# Patient Record
Sex: Female | Born: 1935 | Race: Black or African American | Hispanic: No | Marital: Single | Smoking: Never smoker
Health system: Southern US, Community
[De-identification: ages and names within clinical notes are randomized; demographics above are authoritative.]

## PROBLEM LIST (undated history)

## (undated) DIAGNOSIS — M1990 Unspecified osteoarthritis, unspecified site: Secondary | ICD-10-CM

## (undated) DIAGNOSIS — M545 Low back pain, unspecified: Secondary | ICD-10-CM

## (undated) HISTORY — PX: OTHER SURGICAL HISTORY: SHX169

---

## 2004-04-01 ENCOUNTER — Emergency Department: Payer: Self-pay | Admitting: Unknown Physician Specialty

## 2004-04-01 ENCOUNTER — Other Ambulatory Visit: Payer: Self-pay

## 2004-12-19 ENCOUNTER — Ambulatory Visit: Payer: Self-pay | Admitting: Internal Medicine

## 2005-01-05 ENCOUNTER — Ambulatory Visit: Payer: Self-pay | Admitting: Internal Medicine

## 2005-11-21 ENCOUNTER — Ambulatory Visit: Payer: Self-pay | Admitting: Specialist

## 2005-12-10 ENCOUNTER — Emergency Department: Payer: Self-pay | Admitting: Emergency Medicine

## 2005-12-28 ENCOUNTER — Ambulatory Visit: Payer: Self-pay | Admitting: Internal Medicine

## 2007-01-01 ENCOUNTER — Ambulatory Visit: Payer: Self-pay | Admitting: Internal Medicine

## 2008-01-02 ENCOUNTER — Ambulatory Visit: Payer: Self-pay | Admitting: Internal Medicine

## 2008-04-04 IMAGING — RF DG BARIUM SWALLOW
1 series · 15 of 18 positions shown · non-contrast
Comparison: none

REASON FOR EXAM: dysphagia
COMMENTS:

[Series 1: run · 10 acquisitions, 15 frames shown]
[im 1/10]
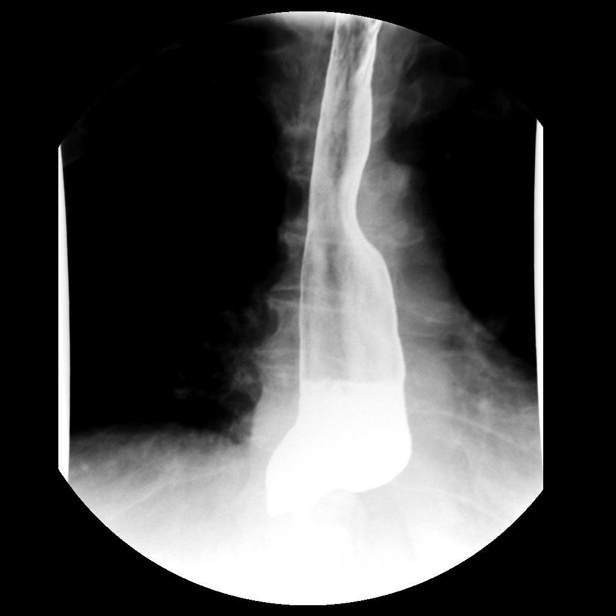
[im 1/10]
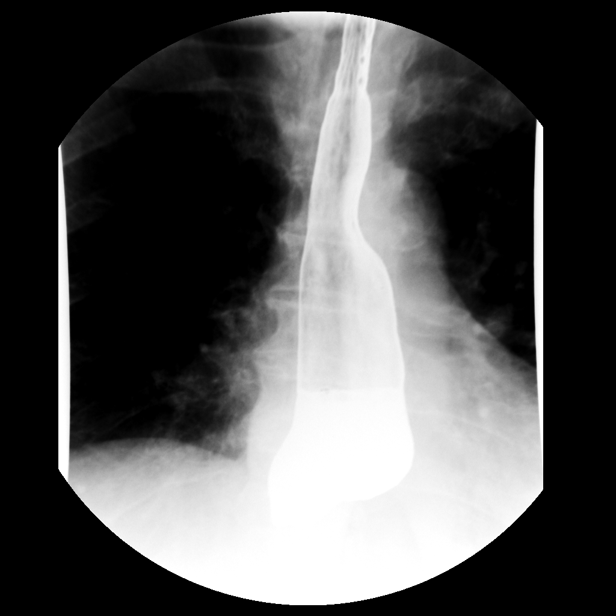
[im 2/10]
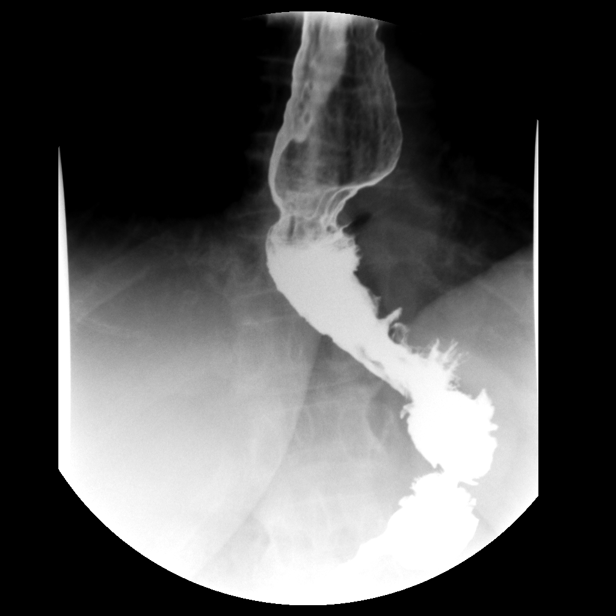
[im 3/10]
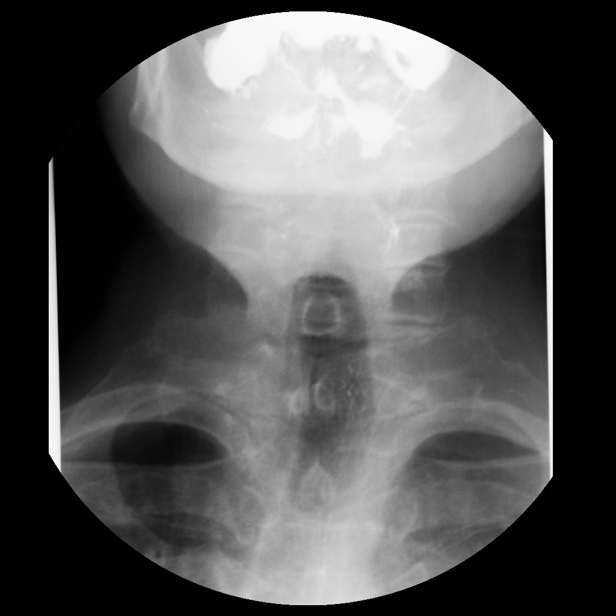
[im 3/10]
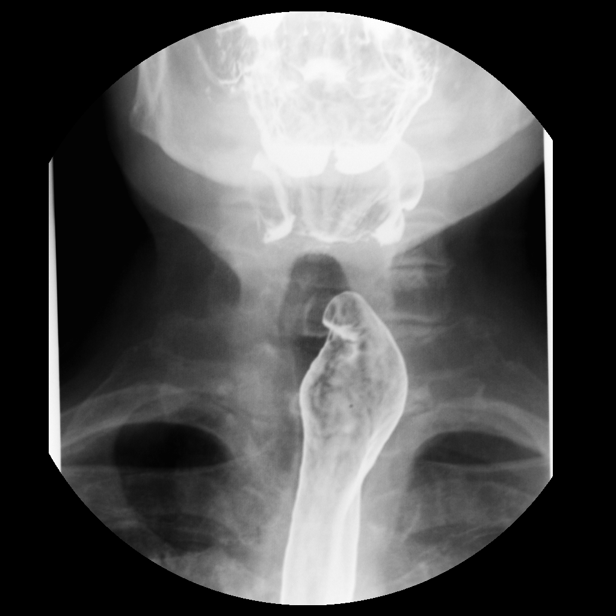
[im 3/10]
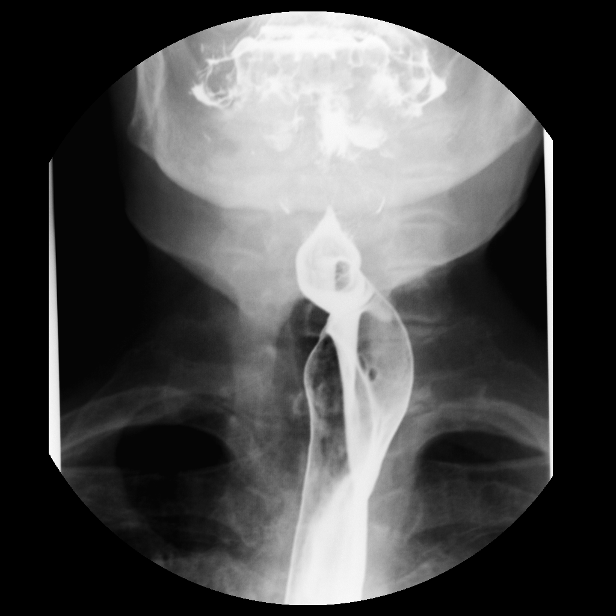
[im 3/10]
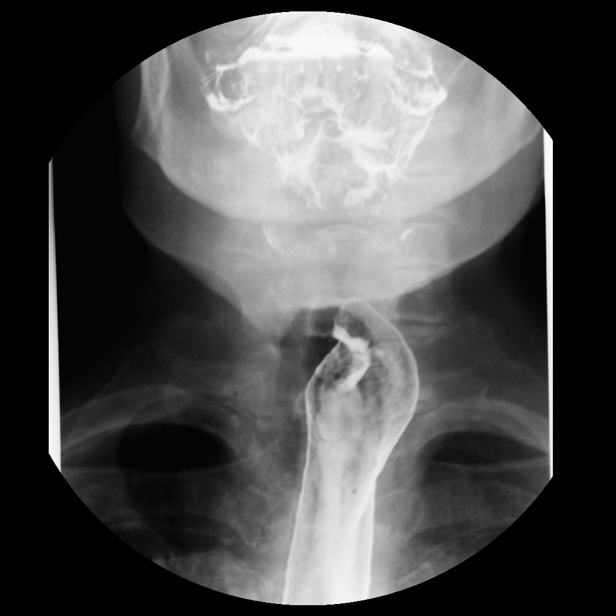
[im 4/10]
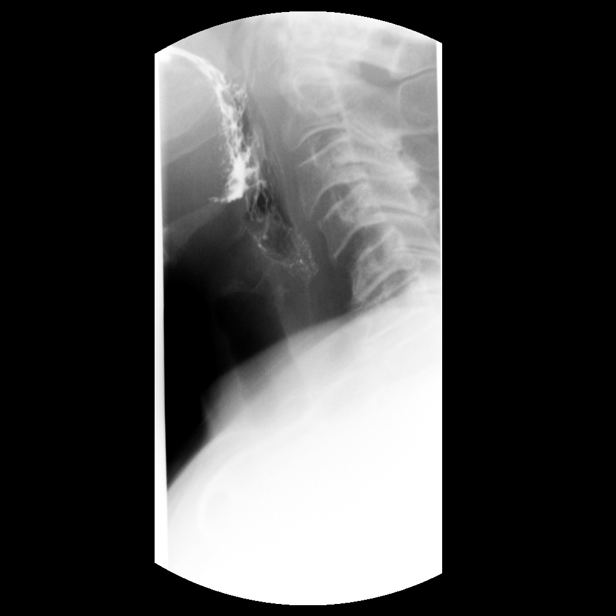
[im 4/10]
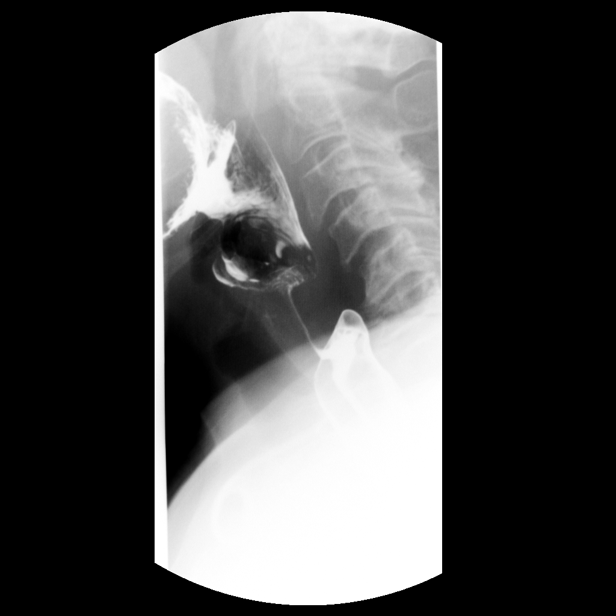
[im 4/10]
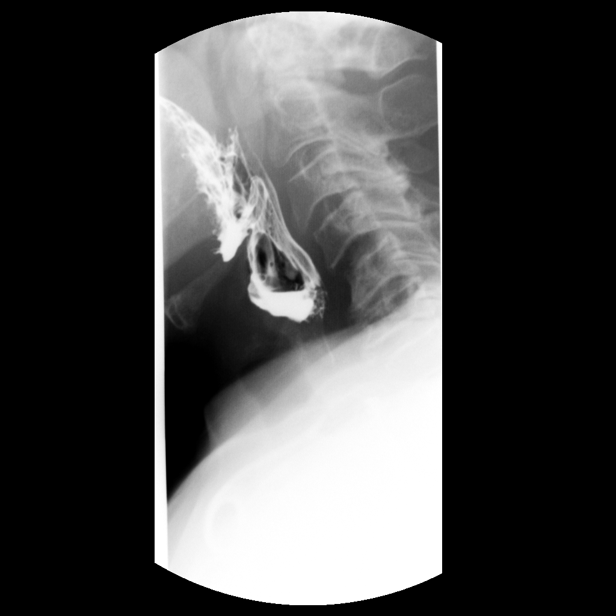
[im 5/10]
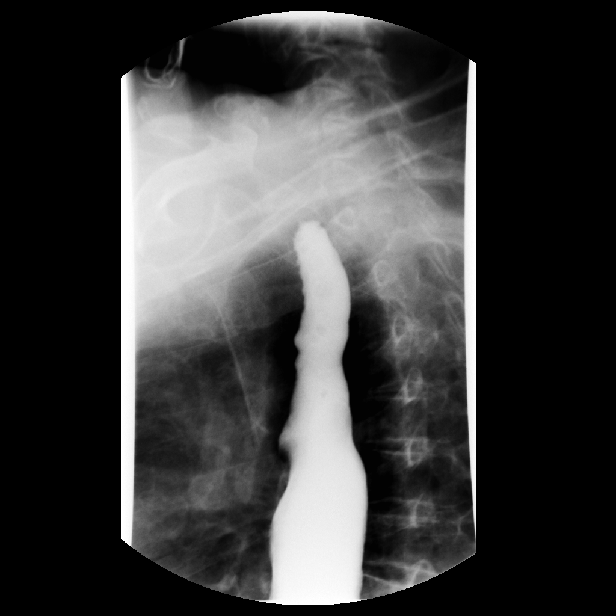
[im 6/10]
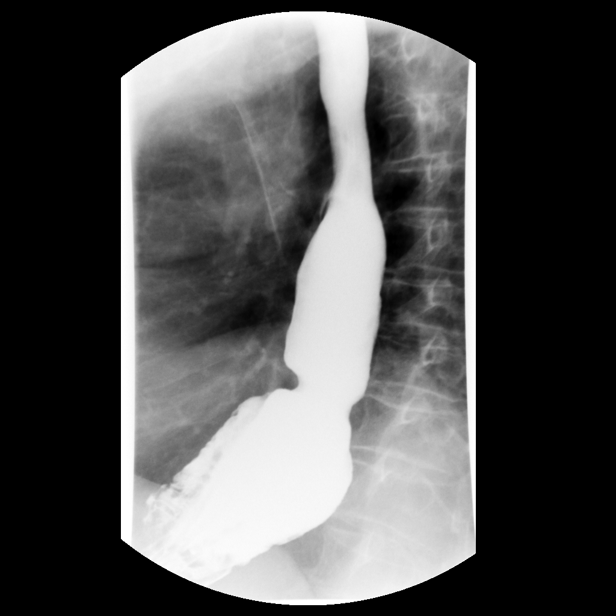
[im 8/10]
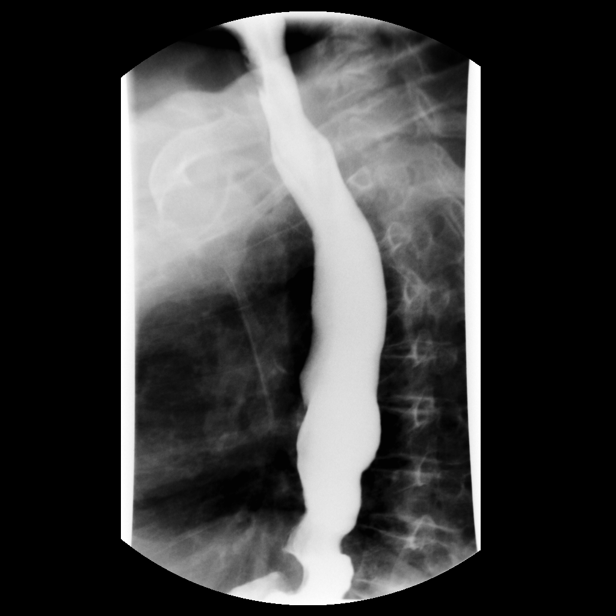
[im 9/10]
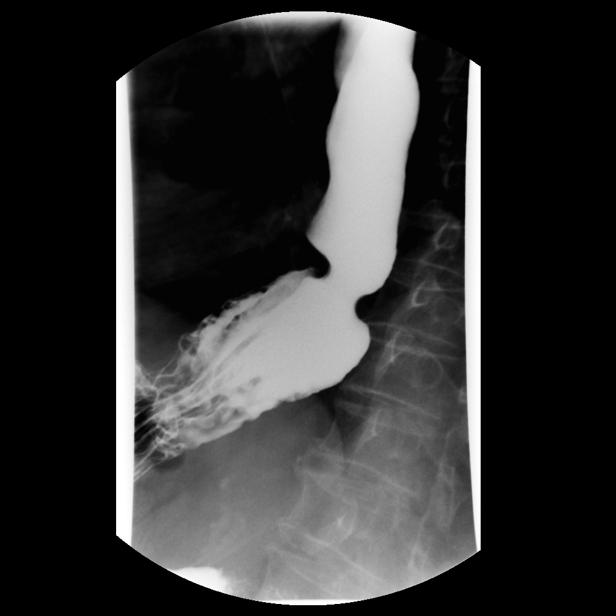
[im 10/10]
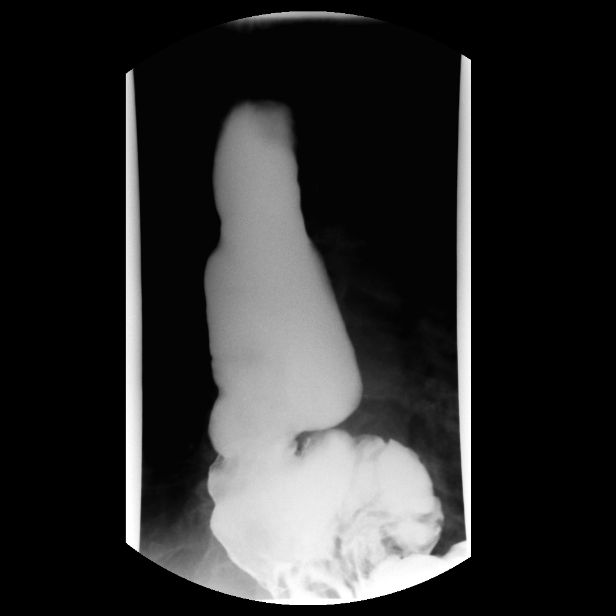

[15 of 18 positions shown; findings below may reference images not displayed]

PROCEDURE:     FL  - FL BARIUM SWALLOW  - November 21, 2005  [DATE]

RESULT:     The cervical esophagus was first examined.  Barium passed
through the cervical esophagus unobstructed. No areas of narrowing are noted.

The remainder of the esophagus reveals a hiatal hernia, direct sliding type,
with a large amount of gastroesophageal reflux noted.  There is some
distention of the lower esophagus.  The 12.5 mm barium impregnated tablet
traversed through the esophagus into the hiatal hernia without incident.
IMPRESSION: 1)Moderate sized direct sliding type hiatal hernia with a large amount of
gastroesophageal reflux noted.

## 2008-06-15 ENCOUNTER — Inpatient Hospital Stay: Payer: Self-pay | Admitting: Internal Medicine

## 2008-08-14 ENCOUNTER — Ambulatory Visit: Payer: Self-pay | Admitting: Internal Medicine

## 2009-03-16 ENCOUNTER — Ambulatory Visit: Payer: Self-pay | Admitting: Internal Medicine

## 2009-04-21 ENCOUNTER — Ambulatory Visit: Payer: Self-pay | Admitting: Internal Medicine

## 2009-05-03 ENCOUNTER — Ambulatory Visit: Payer: Self-pay | Admitting: Internal Medicine

## 2009-09-14 ENCOUNTER — Ambulatory Visit: Payer: Self-pay | Admitting: Internal Medicine

## 2010-03-25 ENCOUNTER — Ambulatory Visit: Payer: Self-pay | Admitting: Internal Medicine

## 2010-09-13 ENCOUNTER — Ambulatory Visit: Payer: Self-pay | Admitting: Otolaryngology

## 2010-09-27 ENCOUNTER — Encounter: Payer: Self-pay | Admitting: Otolaryngology

## 2010-10-04 ENCOUNTER — Encounter: Payer: Self-pay | Admitting: Otolaryngology

## 2011-05-03 ENCOUNTER — Ambulatory Visit: Payer: Self-pay | Admitting: Internal Medicine

## 2012-02-13 ENCOUNTER — Ambulatory Visit: Payer: Self-pay | Admitting: Internal Medicine

## 2012-03-26 ENCOUNTER — Ambulatory Visit: Payer: Self-pay | Admitting: Surgery

## 2012-03-27 ENCOUNTER — Ambulatory Visit: Payer: Self-pay | Admitting: Surgery

## 2012-04-01 ENCOUNTER — Inpatient Hospital Stay: Payer: Self-pay | Admitting: Surgery

## 2012-04-02 LAB — CBC WITH DIFFERENTIAL/PLATELET
Basophil %: 0.5 %
Eosinophil %: 2.4 %
HCT: 38.3 % (ref 35.0–47.0)
HGB: 12.4 g/dL (ref 12.0–16.0)
Lymphocyte %: 13.5 %
MCH: 26.2 pg (ref 26.0–34.0)
Monocyte #: 0.6 x10 3/mm (ref 0.2–0.9)
Monocyte %: 6.5 %
Neutrophil %: 77.1 %
RBC: 4.73 10*6/uL (ref 3.80–5.20)
RDW: 14.8 % — ABNORMAL HIGH (ref 11.5–14.5)
WBC: 9.9 10*3/uL (ref 3.6–11.0)

## 2012-04-02 LAB — BASIC METABOLIC PANEL
Calcium, Total: 8.5 mg/dL (ref 8.5–10.1)
Chloride: 108 mmol/L — ABNORMAL HIGH (ref 98–107)
Co2: 28 mmol/L (ref 21–32)
Creatinine: 1.07 mg/dL (ref 0.60–1.30)
Sodium: 142 mmol/L (ref 136–145)

## 2012-04-02 LAB — PATHOLOGY REPORT

## 2012-04-09 ENCOUNTER — Encounter: Payer: Self-pay | Admitting: Internal Medicine

## 2012-07-11 ENCOUNTER — Ambulatory Visit: Payer: Self-pay | Admitting: Internal Medicine

## 2013-08-12 ENCOUNTER — Ambulatory Visit: Payer: Self-pay | Admitting: Specialist

## 2013-09-08 ENCOUNTER — Ambulatory Visit: Payer: Self-pay | Admitting: Internal Medicine

## 2014-03-25 ENCOUNTER — Ambulatory Visit: Payer: Self-pay | Admitting: Unknown Physician Specialty

## 2014-07-16 ENCOUNTER — Ambulatory Visit: Payer: Self-pay | Admitting: Internal Medicine

## 2014-09-22 NOTE — Op Note (Signed)
PATIENT NAME:  ERNIE, SAGRERO MR#:  [id number] DATE OF BIRTH:  [birth year]  DATE OF PROCEDURE:  04/01/2012  PREOPERATIVE DIAGNOSES: Diverticulitis, colovesical fistula, urinary tract infection.   POSTOPERATIVE DIAGNOSES: Diverticulitis, colovesical fistula, urinary tract infection.   PROCEDURES: Low anterior resection of sigmoid colon.  SURGEON: Adella Hare, MD  ANESTHESIA: General.  INDICATIONS: This 81 year old female has recent symptoms of passing air in her urine, describes a noisy sound, also has recently had diffuse abdominal pain. Urine culture demonstrated bacteria. She reports nocturia x4.  She also reports extreme urgency to empty her bladder.   Recent CT scan demonstrated proximity of the sigmoid colon to the bladder with evidence of inflammation of the sigmoid colon. Barium enema demonstrated dye in the colon passed through a fistula into the bladder. Surgery is recommended for definitive treatment.   DESCRIPTION OF PROCEDURE: The patient was placed on the operating table, in the supine position, under general endotracheal anesthesia. The legs were elevated into the lithotomy position using bumblebee stirrups.  The circulating nurse inserted a Foley urinary catheter with Betadine preparation of the perineum draining a clear, yellow urine. Digital rectal examination evacuated a small amount of bilious fecal material which was cleaned away from the anal area.  The abdomen was prepared with Chlor Prep and perineum and anal area prepared with Betadine. The abdomen was draped out in a sterile manner.    A lower abdominal midline incision was made at the site of an old scar. The dissection was carried down through subcutaneous tissue. Several small bleeding points were cauterized.  The midline fascia was incised. The peritoneal cavity was opened.  Initial inspection revealed that there was adherence of the omentum to the lower abdomen in the region of the bladder and the anterior  abdominal wall. Multiple adhesions were taken down with blunt and sharp dissection freeing up the omentum from the bladder.  Manual examination demonstrated no palpable mass within the liver. There were multiple adhesions in the pelvis finding portions of the small intestine and these adhesions were taken down with blunt and sharp dissection.  Visible small intestines otherwise appeared normal. The patient was placed in Trendelenburg position and small bowel was brought up out of the pelvis and held with three moist lap packs.  Next, the sigmoid colon was mobilized with dissection of multiple adhesions separating it from the left pelvic sidewall.  Sigmoid colon was also mobilized with excision of the retroperitoneum.  Further dissection was carried out where the sigmoid colon was found to be densely adherent to the bladder.  There was evidence of marked inflammation. I did not actually see any pus. Dissection was carried out to separate the colon from the bladder using blunt and sharp dissection and use of Harmonic scalpel and electrocautery. With time the sigmoid colon was freed up from the bladder. There was an inflammatory mass within this portion of sigmoid colon and after separating it the bladder was examined. There was no actual urine seen leaking out as this was observed.  Hemostasis adjacent to the bladder was intact.  Next, the distal sigmoid colon was further mobilized with dissection of multiple adhesions and elected to remove a short segment of colon which had been adherent to the bladder. This segment was approximately 5 inches in length and the proximal and distal margins were selected.  Proximal margin was dissected creating a window in the mesentery and mesenteric dissection was begun with a Harmonic scalpel. Also, the distal portion deep within the pelvis was  dissected so that a window was created around the distal portion of sigmoid colon and Harmonic scalpel was used to begin mesenteric  dissection at that point. It was noted that during the course of mesenteric dissection there was one palpable artery which was suture ligated with 0 chromic and then divided with the Harmonic scalpel.  To further facilitate mesenteric dissection, the proximal portion of bowel was divided with a GIA stapler and then the mesenteric dissection was further completed. The TIA stapler was placed across the distal portion of bowel. The proximal end was tagged with a stitch and the bowel was passed off to a side table. Next, I could see that hemostasis was intact.  A medium sizer was inserted through a small opening in the drape into the rectum, however, I found, with bimanual palpation, that the sizer would not pass from the rectum up to the staple line and elected to do the anastomosis with triangulation technique.  That one glove was changed and subsequently excised each of the staple lines with electrocautery. The two segments of bowel were brought adjacent to one another. The anastomosis was begun with the posterior wall using the T-30 stapler.  A portion of the anterior wall was incised and lengthened the staple line and the anastomosis was completed with two additional applications of the TIA-30 stapler. The anastomosis was inspected and appeared to be widely patent. The anterior portion of anastomosis which would be closest to the bladder was imbricated with 5-0 Vicryl. The mesenteric defect was minimal. The pelvis was irrigated with warm saline solution and inspected and hemostasis was intact.  Next, the lap packs were counted as they were removed and count was correct. I further examined the omentum and appeared that although it had been peeled away from the bladder with perineal elasticity would now easily reach the bladder so it was not sutured to separate the colon from the bladder as there was inadequate omentum. Next, the omentum was brought beneath the wound. The peritoneum was closed with a running 0  chromic suture. The fascia was closed with interrupted 0 Maxon figure-of-eight sutures, skin was closed with clips, and dressings were applied with paper tape.  It was noted that there was slight blood tinge in the urine and it is noted that the bladder was again inspected prior to closure and there was no urine seen leaking.   The patient appeared to tolerate the procedure satisfactorily and was then prepared for transfer to the recovery room. ____________________________ Shela CommonsJ. Renda RollsWilton Smith, MD jws:slb D: 04/01/2012 12:31:53 ET T: 04/01/2012 12:37:24 ET JOB#: [id number]  cc: Adella HareJ. Wilton Smith, MD, <Dictator> Adella HareWILTON J SMITH MD ELECTRONICALLY SIGNED 04/02/2012 10:52

## 2014-09-22 NOTE — Discharge Summary (Signed)
PATIENT NAME:  Gloria HutchingTERSON, Genia M MR#:  [id number] DATE OF BIRTH:  05-Oct-1915  DATE OF ADMISSION:  04/01/2012 DATE OF DISCHARGE:  04/09/2012   HISTORY OF PRESENT ILLNESS: This 81 year old female was admitted for elective surgery. She has a history of pneumaturia, dysuria, and urinary tract infection. She had CT findings of inflammatory mass involving the sigmoid colon adjacent to the bladder with air seen in the bladder. She also has had barium enema which demonstrated colovesical fistula.   PAST MEDICAL HISTORY:  1. Chronic obstructive pulmonary disease.  2. Sleep apnea. Has a CPAP machine but does not always use it.   Other details of past medical history, medicines, and physical findings are recorded on the typed history and physical.   It is noted that recent lab work included a hemoglobin of 13.2 and a urine culture the week before admission demonstrated Escherichia coli 80,000 colonies/mL which was a catheterized specimen.  HOSPITAL COURSE: The patient was brought into the outpatient surgery department after a bowel prep at home, carried to the operating room where she had a low anterior resection of the sigmoid colon and within that port colon was dissected free from the bladder and resected with primary anastomosis. She did receive a preop prophylactic antibiotic and then the next day began her on Bactrim as treatment for her preop condition of urinary infection. She was also treated with subcutaneous heparin prophylactic. She was given IV fluids and with time began a liquid diet and gradually advanced. She did have two small bowel movements not long after surgery. She has laid in the hospital stay, gone a number of days without a bowel movement and has been treated on November 4th with Milk of Magnesia and MiraLAX. She reports resolution of pneumaturia and has been voiding satisfactorily, having no dysuria. She has been walking in the hall, taking solid food gradually and recovering. Her wound  is healing satisfactorily. Abdomen is with minimal tenderness.   Pathology demonstrated diverticulitis with fistulous tract.    FINAL DIAGNOSES:  1. Colovesical fistula. 2. Diverticulitis.  3. Urinary tract infection.   OPERATION: Low anterior resection of the sigmoid colon.   Anticipating discharge once bowel function demonstrated. [facility] one or two weeks of rehabilitation and then return home and will make plans for follow-up in the office.   ____________________________ J. Renda RollsWilton Smith, MD jws:drc D: 04/08/2012 18:07:17 ET T: 04/09/2012 10:19:46 ET JOB#: [id number]  cc: Adella HareJ. Wilton Smith, MD, <Dictator> Adella HareWILTON J SMITH MD ELECTRONICALLY SIGNED 04/09/2012 11:34

## 2014-10-08 ENCOUNTER — Other Ambulatory Visit: Payer: Self-pay | Admitting: Internal Medicine

## 2014-10-08 DIAGNOSIS — Z1231 Encounter for screening mammogram for malignant neoplasm of breast: Secondary | ICD-10-CM

## 2014-10-28 ENCOUNTER — Ambulatory Visit: Payer: Self-pay

## 2014-10-30 ENCOUNTER — Ambulatory Visit
Admission: RE | Admit: 2014-10-30 | Discharge: 2014-10-30 | Disposition: A | Discharge status: Home / Self Care | Payer: Medicare Other | Source: Ambulatory Visit | Attending: Internal Medicine | Admitting: Internal Medicine

## 2014-10-30 ENCOUNTER — Other Ambulatory Visit: Payer: Self-pay | Admitting: Internal Medicine

## 2014-10-30 DIAGNOSIS — Z1231 Encounter for screening mammogram for malignant neoplasm of breast: Secondary | ICD-10-CM | POA: Diagnosis not present

## 2015-08-30 ENCOUNTER — Other Ambulatory Visit: Payer: Self-pay | Admitting: Unknown Physician Specialty

## 2015-08-30 DIAGNOSIS — M1712 Unilateral primary osteoarthritis, left knee: Secondary | ICD-10-CM

## 2015-09-03 ENCOUNTER — Ambulatory Visit
Admission: RE | Admit: 2015-09-03 | Discharge: 2015-09-03 | Disposition: A | Discharge status: Home / Self Care | Payer: Medicare Other | Source: Ambulatory Visit | Attending: Unknown Physician Specialty | Admitting: Unknown Physician Specialty

## 2015-09-03 DIAGNOSIS — M1712 Unilateral primary osteoarthritis, left knee: Secondary | ICD-10-CM | POA: Diagnosis present

## 2015-09-27 ENCOUNTER — Encounter
Admission: RE | Admit: 2015-09-27 | Discharge: 2015-09-27 | Disposition: A | Discharge status: Home / Self Care | Payer: Medicare Other | Source: Ambulatory Visit | Attending: Internal Medicine | Admitting: Internal Medicine

## 2015-10-04 ENCOUNTER — Encounter
Admission: RE | Admit: 2015-10-04 | Discharge: 2015-10-04 | Disposition: A | Discharge status: Home / Self Care | Payer: Medicare Other | Source: Ambulatory Visit | Attending: Internal Medicine | Admitting: Internal Medicine

## 2015-11-18 ENCOUNTER — Other Ambulatory Visit: Payer: Self-pay | Admitting: Internal Medicine

## 2015-11-18 DIAGNOSIS — Z1231 Encounter for screening mammogram for malignant neoplasm of breast: Secondary | ICD-10-CM

## 2015-12-03 ENCOUNTER — Ambulatory Visit
Admission: RE | Admit: 2015-12-03 | Discharge: 2015-12-03 | Disposition: A | Discharge status: Home / Self Care | Payer: Medicare Other | Source: Ambulatory Visit | Attending: Internal Medicine | Admitting: Internal Medicine

## 2015-12-03 ENCOUNTER — Other Ambulatory Visit: Payer: Self-pay | Admitting: Internal Medicine

## 2015-12-03 DIAGNOSIS — Z1231 Encounter for screening mammogram for malignant neoplasm of breast: Secondary | ICD-10-CM

## 2018-05-21 ENCOUNTER — Other Ambulatory Visit: Payer: Self-pay

## 2018-05-21 ENCOUNTER — Emergency Department
Admission: EM | Admit: 2018-05-21 | Discharge: 2018-05-21 | Disposition: A | Discharge status: Home / Self Care | Payer: Medicare Other | Attending: Emergency Medicine | Admitting: Emergency Medicine

## 2018-05-21 ENCOUNTER — Encounter: Payer: Self-pay | Admitting: Emergency Medicine

## 2018-05-21 ENCOUNTER — Emergency Department: Payer: Medicare Other

## 2018-05-21 DIAGNOSIS — Z79899 Other long term (current) drug therapy: Secondary | ICD-10-CM | POA: Diagnosis not present

## 2018-05-21 DIAGNOSIS — M159 Polyosteoarthritis, unspecified: Secondary | ICD-10-CM | POA: Diagnosis not present

## 2018-05-21 DIAGNOSIS — R531 Weakness: Secondary | ICD-10-CM | POA: Diagnosis present

## 2018-05-21 DIAGNOSIS — R102 Pelvic and perineal pain: Secondary | ICD-10-CM | POA: Insufficient documentation

## 2018-05-21 LAB — URINALYSIS, COMPLETE (UACMP) WITH MICROSCOPIC
BACTERIA UA: NONE SEEN
Bilirubin Urine: NEGATIVE
Glucose, UA: NEGATIVE mg/dL
Ketones, ur: NEGATIVE mg/dL
Leukocytes, UA: NEGATIVE
Nitrite: NEGATIVE
Protein, ur: NEGATIVE mg/dL
Specific Gravity, Urine: 1.012 (ref 1.005–1.030)
pH: 6 (ref 5.0–8.0)

## 2018-05-21 LAB — BASIC METABOLIC PANEL
Anion gap: 7 (ref 5–15)
BUN: 21 mg/dL (ref 8–23)
CO2: 26 mmol/L (ref 22–32)
CREATININE: 1.04 mg/dL — AB (ref 0.44–1.00)
Calcium: 9.5 mg/dL (ref 8.9–10.3)
Chloride: 106 mmol/L (ref 98–111)
GFR calc Af Amer: 51 mL/min — ABNORMAL LOW (ref >60)
GFR calc non Af Amer: 44 mL/min — ABNORMAL LOW (ref >60)
Glucose, Bld: 104 mg/dL — ABNORMAL HIGH (ref 70–99)
Potassium: 4.2 mmol/L (ref 3.5–5.1)
Sodium: 139 mmol/L (ref 135–145)

## 2018-05-21 LAB — CBC
HCT: 41 % (ref 36.0–46.0)
Hemoglobin: 13 g/dL (ref 12.0–15.0)
MCH: 27.1 pg (ref 26.0–34.0)
MCHC: 31.7 g/dL (ref 30.0–36.0)
MCV: 85.4 fL (ref 80.0–100.0)
Platelets: 211 10*3/uL (ref 150–400)
RBC: 4.8 MIL/uL (ref 3.87–5.11)
RDW: 13.3 % (ref 11.5–15.5)
WBC: 4.7 10*3/uL (ref 4.0–10.5)
nRBC: 0 % (ref 0.0–0.2)

## 2018-05-21 NOTE — ED Triage Notes (Signed)
Pt reports that for over a week she has gotten more weak and having a more unsteady gait. States that her back and hips are hurting her. She reports that she feels that she is always tired and dizzy at times.

## 2018-05-21 NOTE — ED Provider Notes (Signed)
[facility] Emergency Department Provider Note  ____________________________________________  Time seen: Approximately 8:21 PM  I have reviewed the triage vital signs and the nursing notes.   HISTORY  Chief Complaint Weakness    HPI Gloria Barnes is a 84 y.o. female with past medical history of osteoarthritis who complains of increased difficulty walking over the past several weeks.  This is in the setting of increase in her chronic osteoarthritis pain of bilateral hips and lower back.  She saw a rheumatology several weeks ago who diagnosed her with vitamin D deficiency and started her on a 12-week regimen of weekly vitamin D injections.  After 3 weeks of this, she states that she is starting to feel little bit better.  She denies headache vision changes paresthesias or motor weakness.  Denies being off balance or falls or head injury.  She reports her last fall was about 4 years ago.        History reviewed. No pertinent past medical history.   There are no active problems to display for this patient.    History reviewed. No pertinent surgical history.   Prior to Admission medications   Medication Sig Start Date End Date Taking? Authorizing Provider  acetaminophen (TYLENOL) 650 MG CR tablet Take 650 mg by mouth every 8 (eight) hours as needed.   Yes [provider]  diclofenac sodium (VOLTAREN) 1 % GEL APPLY AS NEEDED ON THE PAINFUL AREA 05/01/18  Yes [provider]  Multiple Vitamin (MULTI-VITAMINS) TABS Take 1 tablet by mouth daily.   Yes [provider]  Omega-3 Fatty Acids (FISH OIL) 1000 MG CAPS Take 1,000 mg by mouth daily.   Yes [provider]  omeprazole (PRILOSEC) 20 MG capsule Take 20 mg by mouth daily. 05/30/17  Yes [provider]  Vitamin D, Ergocalciferol, (DRISDOL) 1.25 MG (50000 UT) CAPS capsule Take 50,000 Units by mouth once a week. 05/06/18  Yes [provider]      Allergies Patient has no known allergies.   Family History  Problem Relation Age of Onset  . Breast cancer Neg Hx     Social History Social History   Tobacco Use  . Smoking status: Never Smoker  Substance Use Topics  . Alcohol use: Not on file  . Drug use: Not on file    Review of Systems  Constitutional:   No fever or chills.  ENT:   No sore throat. No rhinorrhea. Cardiovascular:   No chest pain or syncope. Respiratory:   No dyspnea or cough. Gastrointestinal:   Negative for abdominal pain, vomiting and diarrhea.  Musculoskeletal:   Acute on chronic low back pain and right hip pain. All other systems reviewed and are negative except as documented above in ROS and HPI.  ____________________________________________   PHYSICAL EXAM:  VITAL SIGNS: ED Triage Vitals  Enc Vitals Group     BP 05/21/18 1417 (!) 146/89     Pulse Rate 05/21/18 1417 75     Resp 05/21/18 1530 19     Temp 05/21/18 1417 97.8 F (36.6 C)     Temp Source 05/21/18 1417 Oral     SpO2 05/21/18 1417 100 %     Weight 05/21/18 1417 165 lb (74.8 kg)     Height 05/21/18 1417 5\' 2"  (1.575 m)     Head Circumference --      Peak Flow --      Pain Score 05/21/18 1429 10     Pain Loc --  Pain Edu? --      Excl. in GC? --     Vital signs reviewed, nursing assessments reviewed.   Constitutional:   Alert and oriented. Non-toxic appearance. Eyes:   Conjunctivae are normal. EOMI. PERRL. ENT      Head:   Normocephalic and atraumatic.      Nose:   No congestion/rhinnorhea.       Mouth/Throat:   MMM, no pharyngeal erythema. No peritonsillar mass.       Neck:   No meningismus. Full ROM. Hematological/Lymphatic/Immunilogical:   No cervical lymphadenopathy. Cardiovascular:   RRR. Symmetric bilateral radial and DP pulses.  No murmurs. Cap refill less than 2 seconds. Respiratory:   Normal respiratory effort without tachypnea/retractions. Breath sounds are clear and equal bilaterally. No  wheezes/rales/rhonchi. Gastrointestinal:   Soft and nontender. Non distended. There is no CVA tenderness.  No rebound, rigidity, or guarding.  Musculoskeletal:   Normal range of motion in all extremities. No joint effusions.  No lower extremity tenderness.  No edema.  No midline spinal tenderness.  No focal bony tenderness.  Does have some tenderness centered around the right SI joint. Neurologic:   Normal speech and language.  Motor grossly intact. No acute focal neurologic deficits are appreciated.  Skin:    Skin is warm, dry and intact. No rash noted.  No petechiae, purpura, or bullae.  ____________________________________________    LABS (pertinent positives/negatives) (all labs ordered are listed, but only abnormal results are displayed) Labs Reviewed  BASIC METABOLIC PANEL - Abnormal; Notable for the following components:      Result Value   Glucose, Bld 104 (*)    Creatinine, Ser 1.04 (*)    GFR calc non Af Amer 44 (*)    GFR calc Af Amer 51 (*)    All other components within normal limits  URINALYSIS, COMPLETE (UACMP) WITH MICROSCOPIC - Abnormal; Notable for the following components:   Color, Urine YELLOW (*)    APPearance CLEAR (*)    Hgb urine dipstick SMALL (*)    All other components within normal limits  CBC  CBG MONITORING, ED   ____________________________________________   EKG  Interpreted by me Sinus rhythm rate of 76, left axis, first-degree AV block.  Normal QRS ST segments and T waves.  ____________________________________________    RADIOLOGY  Dg Lumbar Spine 2-3 Views  Result Date: 05/21/2018 CLINICAL DATA:  Right-sided low back pain extending down right hip without known injury. EXAM: LUMBAR SPINE - 2-3 VIEW COMPARISON:  Lumbar spine MRI 07/16/2014 FINDINGS: There is thoracolumbar spondylosis with levoscoliosis. There are 5 non ribbed lumbar type vertebrae. Spinal decompression defect noted at L3. T12 through L4 marked degenerative disc disease  with vacuum disc phenomena. No acute fracture. Moderate aortoiliac atherosclerosis without aneurysm. IMPRESSION: 1. Thoracolumbar spondylosis with levoscoliosis. 2. Spinal decompression defect at L3. 3. No acute osseous abnormality. 4. T12 through L4 marked degenerative disc disease. Electronically Signed   By: Tollie Eth M.D.   On: 05/21/2018 18:52   Dg Pelvis 1-2 Views  Result Date: 05/21/2018 CLINICAL DATA:  Pelvic pain. EXAM: PELVIS - 1-2 VIEW COMPARISON:  None. FINDINGS: Both hips are normally located. Mild/moderate degenerative changes but no fracture or AVN. The pubic symphysis and SI joints are intact. Mild degenerative changes. No pelvic fractures. IMPRESSION: No acute bony findings. Electronically Signed   By: Rudie Meyer M.D.   On: 05/21/2018 18:49    ____________________________________________   PROCEDURES Procedures  ____________________________________________    CLINICAL IMPRESSION / ASSESSMENT AND  PLAN / ED COURSE  Pertinent labs & imaging results that were available during my care of the patient were reviewed by me and considered in my medical decision making (see chart for details).    Patient presents with worsening of her musculoskeletal pain which is a chronic issue for her.  No new falls or trauma.  No neurologic symptoms.  Doubt ischemic or hemorrhagic stroke, increased ICP, infectious process or dehydration.  Vital signs are unremarkable.  Labs are unremarkable.  X-rays of lumbar spine and pelvis are unremarkable.  Patient is ambulatory with a steady gait.  Recommended she increase her walking at home to build strength.  Reminded them that they have a Voltaren prescription from their primary care doctor which they can pick up and apply locally to the painful area which can help with symptoms 2.  Counseled to return to PCP for consideration of physical therapy if symptoms are not improving.      ____________________________________________   FINAL CLINICAL  IMPRESSION(S) / ED DIAGNOSES    Final diagnoses:  Osteoarthritis of multiple joints, unspecified osteoarthritis type     ED Discharge Orders    None      Portions of this note were generated with dragon dictation software. Dictation errors may occur despite best attempts at proofreading.   Sharman CheekStafford, Zeenat Jeanbaptiste, MD 05/21/18 2035

## 2018-05-21 NOTE — ED Notes (Signed)
ED Provider at bedside. 

## 2018-06-17 ENCOUNTER — Emergency Department: Payer: Medicare Other

## 2018-06-17 ENCOUNTER — Observation Stay
Admission: EM | Admit: 2018-06-17 | Discharge: 2018-06-18 | Disposition: A | Discharge status: Home / Self Care | Payer: Medicare Other | Attending: Internal Medicine | Admitting: Internal Medicine

## 2018-06-17 ENCOUNTER — Encounter: Payer: Self-pay | Admitting: Emergency Medicine

## 2018-06-17 ENCOUNTER — Other Ambulatory Visit: Payer: Self-pay

## 2018-06-17 DIAGNOSIS — Z96651 Presence of right artificial knee joint: Secondary | ICD-10-CM | POA: Diagnosis not present

## 2018-06-17 DIAGNOSIS — R9431 Abnormal electrocardiogram [ECG] [EKG]: Secondary | ICD-10-CM | POA: Insufficient documentation

## 2018-06-17 DIAGNOSIS — I248 Other forms of acute ischemic heart disease: Secondary | ICD-10-CM | POA: Insufficient documentation

## 2018-06-17 DIAGNOSIS — Z8249 Family history of ischemic heart disease and other diseases of the circulatory system: Secondary | ICD-10-CM | POA: Diagnosis not present

## 2018-06-17 DIAGNOSIS — R7989 Other specified abnormal findings of blood chemistry: Secondary | ICD-10-CM | POA: Insufficient documentation

## 2018-06-17 DIAGNOSIS — R2689 Other abnormalities of gait and mobility: Secondary | ICD-10-CM | POA: Insufficient documentation

## 2018-06-17 DIAGNOSIS — R42 Dizziness and giddiness: Secondary | ICD-10-CM | POA: Insufficient documentation

## 2018-06-17 DIAGNOSIS — R778 Other specified abnormalities of plasma proteins: Secondary | ICD-10-CM

## 2018-06-17 DIAGNOSIS — G8929 Other chronic pain: Secondary | ICD-10-CM | POA: Diagnosis not present

## 2018-06-17 DIAGNOSIS — Z79899 Other long term (current) drug therapy: Secondary | ICD-10-CM | POA: Insufficient documentation

## 2018-06-17 DIAGNOSIS — M47899 Other spondylosis, site unspecified: Secondary | ICD-10-CM | POA: Insufficient documentation

## 2018-06-17 DIAGNOSIS — Z791 Long term (current) use of non-steroidal anti-inflammatories (NSAID): Secondary | ICD-10-CM | POA: Diagnosis not present

## 2018-06-17 DIAGNOSIS — R2681 Unsteadiness on feet: Secondary | ICD-10-CM | POA: Diagnosis not present

## 2018-06-17 DIAGNOSIS — M4802 Spinal stenosis, cervical region: Secondary | ICD-10-CM | POA: Diagnosis not present

## 2018-06-17 DIAGNOSIS — W19XXXA Unspecified fall, initial encounter: Secondary | ICD-10-CM | POA: Insufficient documentation

## 2018-06-17 DIAGNOSIS — M6281 Muscle weakness (generalized): Secondary | ICD-10-CM | POA: Insufficient documentation

## 2018-06-17 DIAGNOSIS — S39012A Strain of muscle, fascia and tendon of lower back, initial encounter: Principal | ICD-10-CM | POA: Insufficient documentation

## 2018-06-17 DIAGNOSIS — T796XXA Traumatic ischemia of muscle, initial encounter: Secondary | ICD-10-CM | POA: Diagnosis not present

## 2018-06-17 DIAGNOSIS — M549 Dorsalgia, unspecified: Secondary | ICD-10-CM | POA: Diagnosis present

## 2018-06-17 DIAGNOSIS — M5136 Other intervertebral disc degeneration, lumbar region: Secondary | ICD-10-CM | POA: Diagnosis not present

## 2018-06-17 DIAGNOSIS — Z23 Encounter for immunization: Secondary | ICD-10-CM | POA: Diagnosis not present

## 2018-06-17 HISTORY — DX: Low back pain, unspecified: M54.50

## 2018-06-17 HISTORY — DX: Unspecified osteoarthritis, unspecified site: M19.90

## 2018-06-17 HISTORY — DX: Low back pain: M54.5

## 2018-06-17 LAB — TROPONIN I
Troponin I: 0.05 ng/mL (ref <0.03)
Troponin I: 0.05 ng/mL (ref <0.03)

## 2018-06-17 LAB — CK
Total CK: 446 U/L — ABNORMAL HIGH (ref 38–234)
Total CK: 531 U/L — ABNORMAL HIGH (ref 38–234)

## 2018-06-17 LAB — URINALYSIS, COMPLETE (UACMP) WITH MICROSCOPIC
Bacteria, UA: NONE SEEN
Bilirubin Urine: NEGATIVE
GLUCOSE, UA: NEGATIVE mg/dL
Hgb urine dipstick: NEGATIVE
Ketones, ur: NEGATIVE mg/dL
Nitrite: NEGATIVE
Protein, ur: NEGATIVE mg/dL
Specific Gravity, Urine: 1.015 (ref 1.005–1.030)
pH: 6 (ref 5.0–8.0)

## 2018-06-17 LAB — BASIC METABOLIC PANEL
ANION GAP: 6 (ref 5–15)
BUN: 20 mg/dL (ref 8–23)
CO2: 26 mmol/L (ref 22–32)
Calcium: 9.5 mg/dL (ref 8.9–10.3)
Chloride: 108 mmol/L (ref 98–111)
Creatinine, Ser: 0.99 mg/dL (ref 0.44–1.00)
GFR calc Af Amer: 54 mL/min — ABNORMAL LOW (ref >60)
GFR, EST NON AFRICAN AMERICAN: 47 mL/min — AB (ref >60)
GLUCOSE: 118 mg/dL — AB (ref 70–99)
Potassium: 4.2 mmol/L (ref 3.5–5.1)
Sodium: 140 mmol/L (ref 135–145)

## 2018-06-17 LAB — CBC
HCT: 41.4 % (ref 36.0–46.0)
Hemoglobin: 13 g/dL (ref 12.0–15.0)
MCH: 26.9 pg (ref 26.0–34.0)
MCHC: 31.4 g/dL (ref 30.0–36.0)
MCV: 85.5 fL (ref 80.0–100.0)
Platelets: 214 10*3/uL (ref 150–400)
RBC: 4.84 MIL/uL (ref 3.87–5.11)
RDW: 13.6 % (ref 11.5–15.5)
WBC: 7.6 10*3/uL (ref 4.0–10.5)
nRBC: 0 % (ref 0.0–0.2)

## 2018-06-17 LAB — TSH: TSH: 2.228 u[IU]/mL (ref 0.350–4.500)

## 2018-06-17 MED ORDER — MELOXICAM 7.5 MG PO TABS
7.5000 mg | ORAL_TABLET | Freq: Every day | ORAL | Status: DC
  Administered 2018-06-18: 7.5 mg via ORAL
  Filled 2018-06-17 (×2): qty 1

## 2018-06-17 MED ORDER — ACETAMINOPHEN 650 MG RE SUPP: 650.0000 mg | Freq: Four times a day (QID) | RECTAL | Status: DC | PRN

## 2018-06-17 MED ORDER — LATANOPROST 0.005 % OP SOLN
1.0000 [drp] | Freq: Every day | OPHTHALMIC | Status: DC
  Administered 2018-06-17: 1 [drp] via OPHTHALMIC
  Filled 2018-06-17: qty 2.5

## 2018-06-17 MED ORDER — INFLUENZA VAC SPLIT HIGH-DOSE 0.5 ML IM SUSY
0.5000 mL | PREFILLED_SYRINGE | INTRAMUSCULAR | Status: AC
  Administered 2018-06-18: 0.5 mL via INTRAMUSCULAR
  Filled 2018-06-17: qty 0.5

## 2018-06-17 MED ORDER — OMEGA-3-ACID ETHYL ESTERS 1 G PO CAPS
1.0000 g | ORAL_CAPSULE | Freq: Every day | ORAL | Status: DC
  Administered 2018-06-18: 1 g via ORAL
  Filled 2018-06-17: qty 1

## 2018-06-17 MED ORDER — ONDANSETRON HCL 4 MG PO TABS: 4.0000 mg | ORAL_TABLET | Freq: Four times a day (QID) | ORAL | Status: DC | PRN

## 2018-06-17 MED ORDER — PANTOPRAZOLE SODIUM 40 MG PO TBEC
40.0000 mg | DELAYED_RELEASE_TABLET | Freq: Every day | ORAL | Status: DC
  Administered 2018-06-18: 40 mg via ORAL
  Filled 2018-06-17: qty 1

## 2018-06-17 MED ORDER — ACETAMINOPHEN 500 MG PO TABS
1000.0000 mg | ORAL_TABLET | Freq: Once | ORAL | Status: AC
  Administered 2018-06-17: 1000 mg via ORAL
  Filled 2018-06-17: qty 2

## 2018-06-17 MED ORDER — ENOXAPARIN SODIUM 30 MG/0.3ML ~~LOC~~ SOLN
30.0000 mg | SUBCUTANEOUS | Status: DC
  Administered 2018-06-17: 30 mg via SUBCUTANEOUS
  Filled 2018-06-17: qty 0.3

## 2018-06-17 MED ORDER — LIDOCAINE 5 % EX PTCH
1.0000 | MEDICATED_PATCH | CUTANEOUS | Status: DC
  Administered 2018-06-17 – 2018-06-18 (×2): 1 via TRANSDERMAL
  Filled 2018-06-17 (×2): qty 1

## 2018-06-17 MED ORDER — TRAMADOL HCL 50 MG PO TABS
50.0000 mg | ORAL_TABLET | Freq: Four times a day (QID) | ORAL | Status: DC | PRN
  Administered 2018-06-18: 50 mg via ORAL
  Filled 2018-06-17: qty 1

## 2018-06-17 MED ORDER — SODIUM CHLORIDE 0.9 % IV BOLUS
1000.0000 mL | Freq: Once | INTRAVENOUS | Status: AC
  Administered 2018-06-17: 1000 mL via INTRAVENOUS

## 2018-06-17 MED ORDER — SODIUM CHLORIDE 0.9 % IV SOLN
INTRAVENOUS | Status: DC
  Administered 2018-06-17: 21:00:00 via INTRAVENOUS

## 2018-06-17 MED ORDER — ASPIRIN 81 MG PO CHEW
324.0000 mg | CHEWABLE_TABLET | Freq: Once | ORAL | Status: AC
  Administered 2018-06-17: 324 mg via ORAL
  Filled 2018-06-17: qty 4

## 2018-06-17 MED ORDER — MORPHINE SULFATE (PF) 2 MG/ML IV SOLN: 1.0000 mg | INTRAVENOUS | Status: DC | PRN

## 2018-06-17 MED ORDER — ADULT MULTIVITAMIN W/MINERALS CH
1.0000 | ORAL_TABLET | Freq: Every day | ORAL | Status: DC
  Administered 2018-06-18: 1 via ORAL
  Filled 2018-06-17: qty 1

## 2018-06-17 MED ORDER — ACETAMINOPHEN 325 MG PO TABS: 650.0000 mg | ORAL_TABLET | Freq: Four times a day (QID) | ORAL | Status: DC | PRN

## 2018-06-17 MED ORDER — ONDANSETRON HCL 4 MG/2ML IJ SOLN: 4.0000 mg | Freq: Four times a day (QID) | INTRAMUSCULAR | Status: DC | PRN

## 2018-06-17 MED ORDER — SODIUM CHLORIDE 0.9 % IV BOLUS
500.0000 mL | Freq: Once | INTRAVENOUS | Status: AC
  Administered 2018-06-17: 500 mL via INTRAVENOUS

## 2018-06-17 NOTE — ED Notes (Signed)
Lab notified to run add on Troponin and CK level.

## 2018-06-17 NOTE — ED Provider Notes (Addendum)
[facility] Emergency Department Provider Note  ____________________________________________  Time seen: Approximately 10:11 AM  I have reviewed the triage vital signs and the nursing notes.   HISTORY  Chief Complaint Dizziness and Fall    HPI Gloria Barnes is a 85 y.o. female w/ a hx of chronic back pain from osteoarthritis presenting for back pain after fall.  The pt is a poor historian and cannot give me exact details.  Which she states is, "I was walking in the door and I got lightheaded and fell."  She reports that she did not have a syncopal episode or any loss of consciousness.  She did not strike her head and is not anticoagulated.  She was unable to get up due to new acute on chronic low back pain, so laid on the floor all night, and then pressed her life alert in the morning.  At this time, she denies any numbness tingling or weakness, urinary or fecal incontinence or retention, but she does have ongoing back pain which is better if she lays flat.  She denies any associated chest pain, shortness of breath, palpitations.  History reviewed. No pertinent past medical history.  There are no active problems to display for this patient.   History reviewed. No pertinent surgical history.  Current Outpatient Rx  . Order #: [id number] Class: Historical Med  . Order #: [id number] Class: Historical Med  . Order #: [id number] Class: Historical Med  . Order #: [id number] Class: Historical Med  . Order #: [id number] Class: Historical Med  . Order #: [id number] Class: Historical Med  . Order #: [id number] Class: Historical Med    Allergies Patient has no known allergies.  Family History  Problem Relation Age of Onset  . Breast cancer Neg Hx     Social History Social History   Tobacco Use  . Smoking status: Never Smoker  . Smokeless tobacco: Never Used  Substance Use Topics  . Alcohol use: Not Currently  . Drug use: Never    Review of  Systems Constitutional: No fever/chills.  Positive lightheadedness without syncope.  Positive fall. Eyes: No visual changes.  No blurred or double vision. ENT: No sore throat. No congestion or rhinorrhea. Cardiovascular: Denies chest pain. Denies palpitations. Respiratory: Denies shortness of breath.  No cough. Gastrointestinal: No abdominal pain.  No nausea, no vomiting.  No diarrhea.  No constipation. Genitourinary: Negative for dysuria.  No urinary frequency. Musculoskeletal: Positive for acute on chronic low back pain. Skin: Negative for rash. Neurological: Negative for headaches. No focal numbness, tingling or weakness.  No saddle anesthesia.  No urinary or fecal incontinence or retention.    ____________________________________________   PHYSICAL EXAM:  VITAL SIGNS: ED Triage Vitals  Enc Vitals Group     BP --      Pulse --      Resp --      Temp --      Temp src --      SpO2 --      Weight 06/17/18 0939 160 lb (72.6 kg)     Height 06/17/18 0939 5\' 2"  (1.575 m)     Head Circumference --      Peak Flow --      Pain Score 06/17/18 0938 10     Pain Loc --      Pain Edu? --      Excl. in GC? --     Constitutional: The patient is alert and mildly demented.  GCS is 15.   Eyes: Conjunctivae are normal.  EOMI. PERRLA.  No scleral icterus.  Positive arcus senilis.  No raccoon eyes. Head: Atraumatic.  No battle sign. Nose: No congestion/rhinnorhea.  No swelling over the nose or septal hematoma. Mouth/Throat: Mucous membranes are moist.  No dental injury or malocclusion. Neck: No stridor.  Supple.  No midline C-spine tenderness to palpation, step-offs or deformities. Cardiovascular: Normal rate, regular rhythm. No murmurs, rubs or gallops.  Respiratory: Normal respiratory effort.  No accessory muscle use or retractions. Lungs CTAB.  No wheezes, rales or ronchi. Gastrointestinal: Soft, nontender and mildly distended.  No guarding or rebound.  No peritoneal  signs. Musculoskeletal: No midline thoracic spine tenderness to palpation, step-offs or deformities.  The patient does have upper and mid diffuse lumbar spine tenderness to palpation in the midline without any step-offs or deformities; there is no overlying bruising or skin abnormality.  Pelvis is stable.  The patient has full range of motion of the bilateral hips, limited range of motion of the bilateral knees which she states is baseline, and full range of motion of the ankles without pain.  She has full range of motion of the bilateral shoulders, elbows and wrists without pain.  No LE edema. No ttp in the calves or palpable cords.  Negative Homan's sign. Neurologic: Alert, speech is clear.  Face and smile are symmetric.  EOMI.  Moves all extremities well. Skin:  Skin is warm, dry and intact. No rash noted. Psychiatric: Mood and affect are normal.   ____________________________________________   LABS (all labs ordered are listed, but only abnormal results are displayed)  Labs Reviewed  BASIC METABOLIC PANEL - Abnormal; Notable for the following components:      Result Value   Glucose, Bld 118 (*)    GFR calc non Af Amer 47 (*)    GFR calc Af Amer 54 (*)    All other components within normal limits  URINALYSIS, COMPLETE (UACMP) WITH MICROSCOPIC - Abnormal; Notable for the following components:   Color, Urine YELLOW (*)    APPearance CLEAR (*)    Leukocytes, UA TRACE (*)    All other components within normal limits  CK - Abnormal; Notable for the following components:   Total CK 446 (*)    All other components within normal limits  TROPONIN I - Abnormal; Notable for the following components:   Troponin I 0.05 (*)    All other components within normal limits  TROPONIN I - Abnormal; Notable for the following components:   Troponin I 0.05 (*)    All other components within normal limits  CK - Abnormal; Notable for the following components:   Total CK 531 (*)    All other components  within normal limits  CBC  CBG MONITORING, ED   ____________________________________________  EKG  ED ECG REPORT I, Anne-Caroline Sharma CovertNorman, the attending physician, personally viewed and interpreted this ECG.   Date: 06/17/2018  EKG Time: 941  Rate: 76  Rhythm: normal sinus rhythm  Axis: lertward  Intervals:first-degree A-V block ; prolonged QTc  ST&T Change: No STEMI  Repeat EKG: ED ECG REPORT I, Anne-Caroline Sharma CovertNorman, the attending physician, personally viewed and interpreted this ECG.   Date: 06/17/2018  EKG Time: 1138  Rate: 71  Rhythm: normal sinus rhythm  Axis: leftward  Intervals:first degree block and prolonged QTc  ST&T Change: No STEMI   ____________________________________________  RADIOLOGY  Dg Lumbar Spine Complete  Result Date: 06/17/2018 CLINICAL DATA:  Fall last evening, with persistent low back EXAM: LUMBAR SPINE - COMPLETE 4+  VIEW COMPARISON:  Lumbar spine MRI-07/16/2014 FINDINGS: There are 5 non rib-bearing lumbar type vertebral bodies. There is a mild scoliotic curvature of the thoracolumbar spine with dominant caudal component convex the left measuring approximately 14 degrees (as measured from the superior endplate of T12 to the inferior endplate of L4). There is unchanged grade 1 anterolisthesis of L4 upon L5 measuring approximately 6 mm, similar to lumbar spine MRI performed 07/2014. Moderate multilevel lumbar spine DDD, worse at L2-L3 and L3-L4, with disc space height loss, endplate irregularity and small posteriorly directed disc osteophyte complexes at these locations. Mild degenerative change of the bilateral SI joints with joint space loss and inferiorly directed osteophytosis. Atherosclerotic plaque within the abdominal aorta. Regional bowel gas pattern is normal. IMPRESSION: 1. No acute findings. 2. Moderate multilevel lumbar spine DDD, worse at L2-L3 and L3-L4, similar to lumbar spine MRI performed 07/2014. Electronically Signed   By: Simonne Come  M.D.   On: 06/17/2018 10:49   Ct Head Wo Contrast  Result Date: 06/17/2018 CLINICAL DATA:  One 85 year old female post fall last night. Felt DC prior to fall. No reported loss of consciousness. Initial encounter. EXAM: CT HEAD WITHOUT CONTRAST TECHNIQUE: Contiguous axial images were obtained from the base of the skull through the vertex without intravenous contrast. COMPARISON:  09/13/2010 MR. FINDINGS: Brain: No intracranial hemorrhage or CT evidence of large acute infarct. Chronic microvascular changes. Global atrophy. No intracranial mass lesion noted on this unenhanced exam. Vascular: Vascular calcifications.  No acute hyperdense vessel. Skull: No skull fracture. Sinuses/Orbits: Post lens replacement. No acute orbital abnormality. Visualized paranasal sinuses clear. Mastoid air cells and middle ear cavities which are visualized are clear. Other: Degenerative changes C1-2 with narrowing cervicomedullary junction and mild mass effect upon the left aspect of the cervicomedullary junction. This has progressed from the prior exam although incompletely assessed. IMPRESSION: 1. No skull fracture or intracranial hemorrhage. 2. Chronic microvascular changes without CT findings of large acute infarct. 3. Global atrophy. 4. Degenerative changes C1-2 with narrowing cervicomedullary junction and mild mass effect upon the left aspect of the cervicomedullary junction. This has progressed from the prior exam although incompletely assessed. Electronically Signed   By: Lacy Duverney M.D.   On: 06/17/2018 10:48    ____________________________________________   PROCEDURES  Procedure(s) performed: None  Procedures  Critical Care performed: No ____________________________________________   INITIAL IMPRESSION / ASSESSMENT AND PLAN / ED COURSE  Pertinent labs & imaging results that were available during my care of the patient were reviewed by me and considered in my medical decision making (see chart for  details).  84 y.o. female with a history of osteoarthritis presenting with lightheadedness, fall, and back pain.  Overall, the patient is hemodynamically stable.  However, I am concerned that she had a lightheaded episode and that she laid on the floor all night.  We will get basic laboratory studies including troponin and CK.  Urinalysis will be obtained.  I will do lumbar spine x-rays to evaluate for any fracture in her back.  I am not seeing any evidence of cauda equina or spinal cord stenosis today.  I will treat the patient with aspirin for her pain.  Given that she does have some mild dementia and cannot give me a full history, will get a CT of the head to rule out any intracranial injury although clinically I am not seeing any abnormalities.  Plan reevaluation for final disposition.  ----------------------------------------- 11:34 AM on 06/17/2018 -----------------------------------------  Patient does have a  troponin of 0.05 today.  Her EKG is reassuring without ischemic changes.  We will plan to repeat this to see if there is any upward trend, but without any history of chest pain or other red flag symptoms, this is likely less likely to represent ACS or an acute MI.  The patient's blood counts are within normal limits and her electrolytes also are reassuring.  CK is pending.  The patient does not have any evidence of new trauma or fracture in the lumbar spine and the CT head does not show any acute intracranial process.  ----------------------------------------- 12:28 PM on 06/17/2018 -----------------------------------------  The patient does have an elevated CK at 446 today.  However, her creatinine is normal, and she is not showing any signs of significant rhabdomyolysis.  I have ordered 1500 cc of intravenous fluids and will recheck her CK.  If it is trending downwards, and her repeat troponin is negative, I think the patient will be safe for discharge home.  Her urinalysis does not  show UTI.  ----------------------------------------- 12:52 PM on 06/17/2018 -----------------------------------------  The patient's repeat troponin is unchanged and she continues to be asymptomatic.  However, she does have a blood pressure of 87/58; I have ordered fluids for her elevated CK and we will continue to monitor her.  She is not on any antihypertensives and is asymptomatic at this time.  ----------------------------------------- 3:02 PM on 06/17/2018 -----------------------------------------  After my low readings, the nurse adjusted the patient's blood pressure cuff and without any intervention she had normal blood pressure.  It appears that these were spurious readings.  At this time, I am awaiting for the patient's repeat CK and anticipate discharge if we are seeing a downward trend.  If it is upward trending, she will need to come into the hospital.  ----------------------------------------- 3:40 PM on 06/17/2018 -----------------------------------------  Unfortunately, despite the patient's intravenous resuscitation, her CK is actually trending upwards.  At this time I will plan to admit her for continued monitoring and ongoing fluid treatment.  ____________________________________________  FINAL CLINICAL IMPRESSION(S) / ED DIAGNOSES  Final diagnoses:  Fall, initial encounter  Elevated troponin  Traumatic rhabdomyolysis, initial encounter (HCC)  Back strain, initial encounter         NEW MEDICATIONS STARTED DURING THIS VISIT:  New Prescriptions   No medications on file      Rockne Menghini, MD 06/17/18 1231    Rockne Menghini, MD 06/17/18 1253    Rockne Menghini, MD 06/17/18 1540

## 2018-06-17 NOTE — ED Triage Notes (Signed)
Pt in via [facility] from [facility].  Pt reports falling last night around 2330, feeling dizzy prior to falling.  Pt with complaints of mid back pain since the fall.  Pt A/Ox4, vitals WDL, NAD noted at this time.

## 2018-06-17 NOTE — Progress Notes (Signed)
Anticoagulation monitoring(Lovenox):  85 yo female ordered Lovenox 40 mg Q24h  Filed Weights   06/17/18 0939  Weight: 160 lb (72.6 kg)   BMI    Lab Results  Component Value Date   CREATININE 0.99 06/17/2018   CREATININE 1.04 (H) 05/21/2018   CREATININE 1.07 04/02/2012   Estimated Creatinine Clearance: 28.2 mL/min (by C-G formula based on SCr of 0.99 mg/dL). Hemoglobin & Hematocrit     Component Value Date/Time   HGB 13.0 06/17/2018 0943   HGB 12.4 04/02/2012 0612   HCT 41.4 06/17/2018 0943   HCT 38.3 04/02/2012 0612     Per Protocol for Patient with estCrcl < 30 ml/min and BMI < 40, will transition to Lovenox 30 mg Q24h.

## 2018-06-17 NOTE — ED Notes (Signed)
Resumed care from [facility].  Pt alert.  Pt ate dinner tray.

## 2018-06-17 NOTE — ED Notes (Signed)
Pt alert and talking .  Pt continues to wait on admission bed.

## 2018-06-17 NOTE — ED Notes (Signed)
EDP to bedside to provide patient with updated plan of care. 

## 2018-06-17 NOTE — ED Notes (Signed)
Pt provided lunch tray per request.

## 2018-06-17 NOTE — ED Notes (Signed)
Report called to candice rn floor nurse 

## 2018-06-17 NOTE — H&P (Signed)
[facility] [place] at [facility]   PATIENT NAME: Gloria Barnes    MR#:  [id number]  DATE OF BIRTH:  [birth year]  DATE OF ADMISSION:  06/17/2018  PRIMARY CARE PHYSICIAN: Gracelyn Nurse, MD   REQUESTING/REFERRING PHYSICIAN: Dr. Rockne Menghini  CHIEF COMPLAINT:   Chief Complaint  Patient presents with  . Dizziness  . Fall    HISTORY OF PRESENT ILLNESS:  Gloria Barnes  is a 85 y.o. female with a known history of chronic low back pain, arthritis presents to hospital secondary to dizziness and fall. Patient is a very sweet pleasant elderly woman who lives at [facility] apartment independent living.  Active at baseline, uses a walker to ambulate.  Yesterday she started feeling lightheaded and dizzy and had a fall.  Since the fall her low back pain has been much worse and she is unable to move around or get up and walk.  Work-up in the ED is negative for any infection.  X-ray showing only degenerative disc disease and no fractures.  She is being admitted under observation for pain control and physical therapy.  PAST MEDICAL HISTORY:   Past Medical History:  Diagnosis Date  . Arthritis   . Low back pain     PAST SURGICAL HISTORY:   Past Surgical History:  Procedure Laterality Date  . low back surgery    . right knee arthroplasty      SOCIAL HISTORY:   Social History   Tobacco Use  . Smoking status: Never Smoker  . Smokeless tobacco: Never Used  Substance Use Topics  . Alcohol use: Not Currently    FAMILY HISTORY:   Family History  Problem Relation Age of Onset  . CAD Mother   . Breast cancer Neg Hx     DRUG ALLERGIES:  No Known Allergies  REVIEW OF SYSTEMS:   Review of Systems  Constitutional: Positive for malaise/fatigue. Negative for chills, fever and weight loss.  HENT: Negative for ear discharge, ear pain, hearing loss, nosebleeds and tinnitus.   Eyes: Negative for blurred vision, double vision and photophobia.    Respiratory: Negative for cough, hemoptysis, shortness of breath and wheezing.   Cardiovascular: Negative for chest pain, palpitations, orthopnea and leg swelling.  Gastrointestinal: Negative for abdominal pain, constipation, diarrhea, heartburn, melena, nausea and vomiting.  Genitourinary: Negative for dysuria, frequency, hematuria and urgency.  Musculoskeletal: Positive for back pain, falls and myalgias. Negative for neck pain.  Skin: Negative for rash.  Neurological: Positive for dizziness. Negative for tingling, tremors, sensory change, speech change, focal weakness and headaches.  Endo/Heme/Allergies: Does not bruise/bleed easily.  Psychiatric/Behavioral: Negative for depression.    MEDICATIONS AT HOME:   Prior to Admission medications   Medication Sig Start Date End Date Taking? Authorizing Provider  acetaminophen (TYLENOL) 650 MG CR tablet Take 650 mg by mouth every 8 (eight) hours as needed.   Yes [provider]  diclofenac sodium (VOLTAREN) 1 % GEL APPLY AS NEEDED ON THE PAINFUL AREA 05/01/18  Yes [provider]  furosemide (LASIX) 20 MG tablet Take 20 mg by mouth daily.   Yes [provider]  latanoprost (XALATAN) 0.005 % ophthalmic solution Place 1 drop into both eyes at bedtime.   Yes [provider]  meloxicam (MOBIC) 7.5 MG tablet Take 7.5 mg by mouth daily. 06/13/18 06/13/19 Yes [provider]  Multiple Vitamin (MULTI-VITAMINS) TABS Take 1 tablet by mouth daily.   Yes [provider]  Omega-3 Fatty Acids (FISH OIL)  1000 MG CAPS Take 1,000 mg by mouth daily.   Yes [provider]  omeprazole (PRILOSEC) 20 MG capsule Take 20 mg by mouth 2 (two) times daily. 06/04/18  Yes [provider]  Vitamin D, Ergocalciferol, (DRISDOL) 1.25 MG (50000 UT) CAPS capsule Take 50,000 Units by mouth once a week. 05/06/18  Yes [provider]      VITAL SIGNS:  Blood pressure (!) 114/59, pulse 79, resp. rate 17,  height 5\' 2"  (1.575 m), weight 72.6 kg, SpO2 99 %.  PHYSICAL EXAMINATION:   Physical Exam  GENERAL:  [age] y.o.-year-old very pleasant elderly patient lying in the bed with no acute distress.  EYES: Pupils equal, round, reactive to light and accommodation. No scleral icterus. Extraocular muscles intact.  HEENT: Head atraumatic, normocephalic. Oropharynx and nasopharynx clear.  NECK:  Supple, no jugular venous distention. No thyroid enlargement, no tenderness.  LUNGS: Normal breath sounds bilaterally, no wheezing, rales,rhonchi or crepitation. No use of accessory muscles of respiration.  Decreased bibasilar breath sounds CARDIOVASCULAR: S1, S2 normal. No  rubs, or gallops.  3/6 systolic murmur is present ABDOMEN: Soft, nontender, nondistended. Bowel sounds present. No organomegaly or mass.  EXTREMITIES: No pedal edema, cyanosis, or clubbing.  NEUROLOGIC: Cranial nerves II through XII are intact. Muscle strength 5/5 in all extremities. Sensation intact. Gait not checked.  PSYCHIATRIC: The patient is alert and oriented x 3.  SKIN: No obvious rash, lesion, or ulcer.   LABORATORY PANEL:   CBC Recent Labs  Lab 06/17/18 0943  WBC 7.6  HGB 13.0  HCT 41.4  PLT 214   ------------------------------------------------------------------------------------------------------------------  Chemistries  Recent Labs  Lab 06/17/18 0943  NA 140  K 4.2  CL 108  CO2 26  GLUCOSE 118*  BUN 20  CREATININE 0.99  CALCIUM 9.5   ------------------------------------------------------------------------------------------------------------------  Cardiac Enzymes Recent Labs  Lab 06/17/18 1150  TROPONINI 0.05*   ------------------------------------------------------------------------------------------------------------------  RADIOLOGY:  Dg Lumbar Spine Complete  Result Date: 06/17/2018 CLINICAL DATA:  Fall last evening, with persistent low back EXAM: LUMBAR SPINE - COMPLETE 4+ VIEW COMPARISON:   Lumbar spine MRI-07/16/2014 FINDINGS: There are 5 non rib-bearing lumbar type vertebral bodies. There is a mild scoliotic curvature of the thoracolumbar spine with dominant caudal component convex the left measuring approximately 14 degrees (as measured from the superior endplate of T12 to the inferior endplate of L4). There is unchanged grade 1 anterolisthesis of L4 upon L5 measuring approximately 6 mm, similar to lumbar spine MRI performed 07/2014. Moderate multilevel lumbar spine DDD, worse at L2-L3 and L3-L4, with disc space height loss, endplate irregularity and small posteriorly directed disc osteophyte complexes at these locations. Mild degenerative change of the bilateral SI joints with joint space loss and inferiorly directed osteophytosis. Atherosclerotic plaque within the abdominal aorta. Regional bowel gas pattern is normal. IMPRESSION: 1. No acute findings. 2. Moderate multilevel lumbar spine DDD, worse at L2-L3 and L3-L4, similar to lumbar spine MRI performed 07/2014. Electronically Signed   By: Simonne ComeJohn  Watts M.D.   On: 06/17/2018 10:49   Ct Head Wo Contrast  Result Date: 06/17/2018 CLINICAL DATA:  One 41hundred year old female post fall last night. Felt DC prior to fall. No reported loss of consciousness. Initial encounter. EXAM: CT HEAD WITHOUT CONTRAST TECHNIQUE: Contiguous axial images were obtained from the base of the skull through the vertex without intravenous contrast. COMPARISON:  09/13/2010 MR. FINDINGS: Brain: No intracranial hemorrhage or CT evidence of large acute infarct. Chronic microvascular changes. Global atrophy. No intracranial mass lesion  noted on this unenhanced exam. Vascular: Vascular calcifications.  No acute hyperdense vessel. Skull: No skull fracture. Sinuses/Orbits: Post lens replacement. No acute orbital abnormality. Visualized paranasal sinuses clear. Mastoid air cells and middle ear cavities which are visualized are clear. Other: Degenerative changes C1-2 with narrowing  cervicomedullary junction and mild mass effect upon the left aspect of the cervicomedullary junction. This has progressed from the prior exam although incompletely assessed. IMPRESSION: 1. No skull fracture or intracranial hemorrhage. 2. Chronic microvascular changes without CT findings of large acute infarct. 3. Global atrophy. 4. Degenerative changes C1-2 with narrowing cervicomedullary junction and mild mass effect upon the left aspect of the cervicomedullary junction. This has progressed from the prior exam although incompletely assessed. Electronically Signed   By: Lacy Duverney M.D.   On: 06/17/2018 10:48    EKG:   Orders placed or performed during the hospital encounter of 06/17/18  . ED EKG  . ED EKG  . EKG 12-Lead  . EKG 12-Lead    IMPRESSION AND PLAN:   Gloria Barnes  is a 85 y.o. female with a known history of chronic low back pain, arthritis presents to hospital secondary to dizziness and fall.  1.  Fall then dizziness-hold Lasix, check orthostatics -Gentle hydration.  Physical therapy consult -Pain medications ordered for low back pain -UA is negative for infection -Very slightly elevated CPK.  2.  Chronic arthritis and low back pain-x-ray showed only degenerative disc disease, no acute fractures -Lidocaine patch, K pad, physical therapy consult.  Pain medications  3.  Elevated troponin-stable.  Secondary to demand ischemia.  No further cardiac work-up needed.  Patient is asymptomatic  4.  DVT prophylaxis-Lovenox    All the records are reviewed and case discussed with ED provider. Management plans discussed with the patient, family and they are in agreement.  CODE STATUS: Full code  TOTAL TIME TAKING CARE OF THIS PATIENT: 51 minutes.    Enid Baas M.D on 06/17/2018 at 4:41 PM  Between 7am to 6pm - Pager - [phone]  After 6pm go to [email or web address] - password [email or web address]  [facility]  Office  [phone]  CC: Primary care  physician; Gracelyn Nurse, MD

## 2018-06-18 DIAGNOSIS — S39012A Strain of muscle, fascia and tendon of lower back, initial encounter: Secondary | ICD-10-CM | POA: Diagnosis not present

## 2018-06-18 LAB — BASIC METABOLIC PANEL
Anion gap: 6 (ref 5–15)
BUN: 21 mg/dL (ref 8–23)
CO2: 23 mmol/L (ref 22–32)
Calcium: 9 mg/dL (ref 8.9–10.3)
Chloride: 111 mmol/L (ref 98–111)
Creatinine, Ser: 0.87 mg/dL (ref 0.44–1.00)
GFR calc Af Amer: >60 mL/min (ref >60)
GFR calc non Af Amer: 55 mL/min — ABNORMAL LOW (ref >60)
GLUCOSE: 105 mg/dL — AB (ref 70–99)
Potassium: 4.3 mmol/L (ref 3.5–5.1)
Sodium: 140 mmol/L (ref 135–145)

## 2018-06-18 LAB — CBC
HCT: 39.2 % (ref 36.0–46.0)
Hemoglobin: 12.2 g/dL (ref 12.0–15.0)
MCH: 26.3 pg (ref 26.0–34.0)
MCHC: 31.1 g/dL (ref 30.0–36.0)
MCV: 84.5 fL (ref 80.0–100.0)
Platelets: 192 10*3/uL (ref 150–400)
RBC: 4.64 MIL/uL (ref 3.87–5.11)
RDW: 13.8 % (ref 11.5–15.5)
WBC: 4.9 10*3/uL (ref 4.0–10.5)
nRBC: 0 % (ref 0.0–0.2)

## 2018-06-18 NOTE — Care Management (Signed)
Notified Barbara Cower at [facility] that patient will need SW, Aide and PT, OT

## 2018-06-18 NOTE — Care Management Obs Status (Signed)
MEDICARE OBSERVATION STATUS NOTIFICATION   Patient Details  Name: Gloria Barnes MRN: [id number] Date of Birth: 04-Sep-1915   Medicare Observation Status Notification Given:  Yes  Patient is over 100 and unable to sign name, made X, copied given  Barrie Dunker, RN 06/18/2018, 11:29 AM

## 2018-06-18 NOTE — NC FL2 (Signed)
[place] MEDICAID FL2 LEVEL OF CARE SCREENING TOOL     IDENTIFICATION  Patient Name: Gloria Barnes Birthdate: [birth year] Sex: female Admission Date (Current Location): 06/17/2018  [facility] and [place] Number:  [address] ([id number]) Facility and Address:  [facility], [address] [place], [place] [address]      Provider Number: [id number]  Attending Physician Name and Address:  Shaune Pollack, MD  Relative Name and Phone Number:       Current Level of Care: Hospital Recommended Level of Care: Skilled Nursing Facility Prior Approval Number:    Date Approved/Denied:   PASRR Number: ([id number] )  Discharge Plan: SNF    Current Diagnoses: Patient Active Problem List   Diagnosis Date Noted  . Back pain 06/17/2018    Orientation RESPIRATION BLADDER Height & Weight     Self, Time, Situation, Place  Normal Continent Weight: 160 lb (72.6 kg) Height:  5\' 2"  (157.5 cm)  BEHAVIORAL SYMPTOMS/MOOD NEUROLOGICAL BOWEL NUTRITION STATUS      Continent Diet(Diet: Heart Healthy )  AMBULATORY STATUS COMMUNICATION OF NEEDS Skin   Limited Assist Verbally Normal                       Personal Care Assistance Level of Assistance  Bathing, Feeding, Dressing Bathing Assistance: Limited assistance Feeding assistance: Independent Dressing Assistance: Limited assistance     Functional Limitations Info  Sight, Hearing, Speech Sight Info: Adequate Hearing Info: Adequate Speech Info: Adequate    SPECIAL CARE FACTORS FREQUENCY  PT (By licensed PT), OT (By licensed OT)     PT Frequency: (5) OT Frequency: (5)            Contractures      Additional Factors Info  Code Status, Allergies Code Status Info: (Full Code. ) Allergies Info: (No Known Allergies. )           Current Medications (06/18/2018):  This is the current hospital active medication list Current Facility-Administered Medications  Medication Dose Route Frequency Provider  Last Rate Last Dose  . acetaminophen (TYLENOL) tablet 650 mg  650 mg Oral Q6H PRN Enid Baas, MD       Or  . acetaminophen (TYLENOL) suppository 650 mg  650 mg Rectal Q6H PRN Enid Baas, MD      . enoxaparin (LOVENOX) injection 30 mg  30 mg Subcutaneous Q24H Enid Baas, MD   30 mg at 06/17/18 2056  . latanoprost (XALATAN) 0.005 % ophthalmic solution 1 drop  1 drop Both Eyes QHS Enid Baas, MD   1 drop at 06/17/18 2056  . lidocaine (LIDODERM) 5 % 1 patch  1 patch Transdermal Q24H Enid Baas, MD   1 patch at 06/17/18 2055  . meloxicam (MOBIC) tablet 7.5 mg  7.5 mg Oral Daily Enid Baas, MD   7.5 mg at 06/18/18 0930  . morphine 2 MG/ML injection 1 mg  1 mg Intravenous Q4H PRN Enid Baas, MD      . multivitamin with minerals tablet 1 tablet  1 tablet Oral Daily Enid Baas, MD   1 tablet at 06/18/18 0930  . omega-3 acid ethyl esters (LOVAZA) capsule 1 g  1 g Oral Daily Enid Baas, MD   1 g at 06/18/18 0930  . ondansetron (ZOFRAN) tablet 4 mg  4 mg Oral Q6H PRN Enid Baas, MD       Or  . ondansetron (ZOFRAN) injection 4 mg  4 mg Intravenous Q6H PRN Enid Baas, MD      .  pantoprazole (PROTONIX) EC tablet 40 mg  40 mg Oral Daily Enid BaasKalisetti, Radhika, MD   40 mg at 06/18/18 0930  . traMADol (ULTRAM) tablet 50 mg  50 mg Oral Q6H PRN Enid BaasKalisetti, Radhika, MD   50 mg at 06/18/18 0940     Discharge Medications: Please see discharge summary for a list of discharge medications.  Relevant Imaging Results:  Relevant Lab Results:   Additional Information (SSN: [id number])  Gloria Barnes, Gloria CrockerBailey M, LCSW

## 2018-06-18 NOTE — Discharge Summary (Signed)
[facility] - [place] at [facility]   PATIENT NAME: Gloria Barnes    MR#:  [id number]  DATE OF BIRTH:  06-28-1915  DATE OF ADMISSION:  06/17/2018   ADMITTING PHYSICIAN: Enid Baas, MD  DATE OF DISCHARGE: No discharge date for patient encounter.  PRIMARY CARE PHYSICIAN: Gracelyn Nurse, MD   ADMISSION DIAGNOSIS:  Elevated troponin [R79.89] Back strain, initial encounter [S39.012A] Fall, initial encounter [W19.XXXA] Traumatic rhabdomyolysis, initial encounter (HCC) [T79.6XXA] DISCHARGE DIAGNOSIS:  Active Problems:   Back pain  SECONDARY DIAGNOSIS:   Past Medical History:  Diagnosis Date  . Arthritis   . Low back pain    HOSPITAL COURSE:  Gloria Barnes  is a 85 y.o. female with a known history of chronic low back pain, arthritis presents to hospital secondary to dizziness and fall.  1.  Fall then dizziness, right buttock pain due to fall. Hold Lasix. -Pain is better controlled. -UA is negative for infection -Very slightly elevated CPK.  2.  Chronic arthritis and low back pain-x-ray showed only degenerative disc disease, no acute fractures Pain is controlled.  3.  Elevated troponin-stable.  Secondary to demand ischemia.  No further cardiac work-up needed.  Patient is asymptomatic. PT evaluation suggest skilled nursing facility placement.  But the patient refused to go to facility.  She wants to go home. DISCHARGE CONDITIONS:  Stable, discharge to home with home health, PT, OT, RN, nurse aide and social worker. CONSULTS OBTAINED:   DRUG ALLERGIES:  No Known Allergies DISCHARGE MEDICATIONS:   Allergies as of 06/18/2018   No Known Allergies     Medication List    STOP taking these medications   furosemide 20 MG tablet Commonly known as:  LASIX     TAKE these medications   acetaminophen 650 MG CR tablet Commonly known as:  TYLENOL Take 650 mg by mouth every 8 (eight) hours as needed.   diclofenac sodium 1 % Gel Commonly  known as:  VOLTAREN APPLY AS NEEDED ON THE PAINFUL AREA   Fish Oil 1000 MG Caps Take 1,000 mg by mouth daily.   latanoprost 0.005 % ophthalmic solution Commonly known as:  XALATAN Place 1 drop into both eyes at bedtime.   meloxicam 7.5 MG tablet Commonly known as:  MOBIC Take 7.5 mg by mouth daily.   MULTI-VITAMINS Tabs Take 1 tablet by mouth daily.   omeprazole 20 MG capsule Commonly known as:  PRILOSEC Take 20 mg by mouth 2 (two) times daily.   Vitamin D (Ergocalciferol) 1.25 MG (50000 UT) Caps capsule Commonly known as:  DRISDOL Take 50,000 Units by mouth once a week.        DISCHARGE INSTRUCTIONS:  See AVS.  If you experience worsening of your admission symptoms, develop shortness of breath, life threatening emergency, suicidal or homicidal thoughts you must seek medical attention immediately by calling 911 or calling your MD immediately  if symptoms less severe.  You Must read complete instructions/literature along with all the possible adverse reactions/side effects for all the Medicines you take and that have been prescribed to you. Take any new Medicines after you have completely understood and accpet all the possible adverse reactions/side effects.   Please note  You were cared for by a hospitalist during your hospital stay. If you have any questions about your discharge medications or the care you received while you were in the hospital after you are discharged, you can call the unit and asked to speak with the hospitalist on call if  the hospitalist that took care of you is not available. Once you are discharged, your primary care physician will handle any further medical issues. Please note that NO REFILLS for any discharge medications will be authorized once you are discharged, as it is imperative that you return to your primary care physician (or establish a relationship with a primary care physician if you do not have one) for your aftercare needs so that they can  reassess your need for medications and monitor your lab values.    On the day of Discharge:  VITAL SIGNS:  Blood pressure 114/83, pulse 83, temperature 98.1 F (36.7 C), temperature source Oral, resp. rate 16, height 5\' 2"  (1.575 m), weight 72.6 kg, SpO2 97 %. PHYSICAL EXAMINATION:  GENERAL:  85 y.o.-year-old patient lying in the bed with no acute distress.  EYES: Pupils equal, round, reactive to light and accommodation. No scleral icterus. Extraocular muscles intact.  HEENT: Head atraumatic, normocephalic. Oropharynx and nasopharynx clear.  NECK:  Supple, no jugular venous distention. No thyroid enlargement, no tenderness.  LUNGS: Normal breath sounds bilaterally, no wheezing, rales,rhonchi or crepitation. No use of accessory muscles of respiration.  CARDIOVASCULAR: S1, S2 normal. No murmurs, rubs, or gallops.  ABDOMEN: Soft, non-tender, non-distended. Bowel sounds present. No organomegaly or mass.  EXTREMITIES: No pedal edema, cyanosis, or clubbing.  NEUROLOGIC: Cranial nerves II through XII are intact. Muscle strength 5/5 in all extremities. Sensation intact. Gait not checked.  PSYCHIATRIC: The patient is alert and oriented x 3.  SKIN: No obvious rash, lesion, or ulcer.  DATA REVIEW:   CBC Recent Labs  Lab 06/18/18 0616  WBC 4.9  HGB 12.2  HCT 39.2  PLT 192    Chemistries  Recent Labs  Lab 06/18/18 0616  NA 140  K 4.3  CL 111  CO2 23  GLUCOSE 105*  BUN 21  CREATININE 0.87  CALCIUM 9.0     Microbiology Results  No results found for this or any previous visit.  RADIOLOGY:  No results found.   Management plans discussed with the patient, family and they are in agreement.  CODE STATUS: Full Code   TOTAL TIME TAKING CARE OF THIS PATIENT: 32 minutes.    Shaune Pollack M.D on 06/18/2018 at 4:11 PM  Between 7am to 6pm - Pager - [phone]  After 6pm go to [email or web address] - Social research officer, government  [facility]  Office  [phone]  CC:  Primary care physician; Gracelyn Nurse, MD   Note: This dictation was prepared with Dragon dictation along with smaller phrase technology. Any transcriptional errors that result from this process are unintentional.

## 2018-06-18 NOTE — Plan of Care (Signed)

## 2018-06-18 NOTE — Care Management Note (Signed)
Case Management Note  Patient Details  Name: Gloria Barnes MRN: [id number] Date of Birth: 02/27/1916  Subjective/Objective:                    Met with patient to discuss discharge plan and to provide with [facility] Provided the patient with the HH list per CMS.GOV Patient will need a 3 in 1 notified [facility] JASON Patient wants to use [facility] for HH PT, notified Jason Patient lives alone in independent living apartment Patient has transportation nephew may be able to pick up when DC Patient says daughter in [place] may come pick up to take her home [facility] has accepted HH for PT Patient is able to afford medications Pharmacy is in [place] Action/Plan:  HH list provided per CMS.gov, Jason notified of 3 in 1 need and HH PT, has accepted Expected Discharge Date:  06/18/18               Expected Discharge Plan:  Home w Home Health Services  In-House Referral:     Discharge planning Services  CM Consult  Post Acute Care Choice:  Durable Medical Equipment Choice offered to:     DME Arranged:  3-N-1 DME Agency:  [facility]  HH Arranged:  PT HH Agency:  [facility]  Status of Service:  In process, will continue to follow  If discussed at Long Length of Stay Meetings, dates discussed:    Additional Comments:  Deliliah J Gregory, RN 06/18/2018, 11:32 AM  

## 2018-06-18 NOTE — Clinical Social Work Note (Signed)
Clinical Social Work Assessment  Patient Details  Name: Gloria Barnes MRN: [id number] Date of Birth: [birth year]  Date of referral:  06/18/18               Reason for consult:  Facility Placement                Permission sought to share information with:  Case Manager Permission granted to share information::  Yes, Verbal Permission Granted  Name::        Agency::     Relationship::     Contact Information:     Housing/Transportation Living arrangements for the past 2 months:  Apartment Source of Information:  Patient Patient Interpreter Needed:  None Criminal Activity/Legal Involvement Pertinent to Current Situation/Hospitalization:  No - Comment as needed Significant Relationships:  Adult Children Lives with:  Self Do you feel safe going back to the place where you live?  Yes Need for family participation in patient care:  Yes (Comment)  Care giving concerns:  Patient lives alone at [facility] senior apartment complex in [place].    Social Worker assessment / plan:  Holiday representative (CSW) received verbal consult from PT that patient will need placement or 24/7 supervision. CSW met with patient alone at bedside to discuss D/C plan. Patient was alert and oriented X4 and was sitting up in the chair at bedside. CSW introduced self and explained role of CSW department. Per patient she lives in an apartment and her daughter Gloria Barnes and and son in law live in [place]. CSW explained placement options. Patient refused facility placement and stated that she wants to go home today. Patient is agreeable to home health. RN case manager is aware of above and will add home health social worker. Patient reported that she has good neighbors and friends that live around her to check on her daily. RN and MD aware of above. CSW will continue to follow and assist as needed.   Employment status:  Retired, Disabled (Comment on whether or not currently receiving Disability) Insurance  information:  [place], [place] PT Recommendations:  [place] / Referral to community resources:  Other (Comment Required)(Patient refused SNF and is agreeable to home health. )  Patient/Family's Response to care:  Patient refused facility placement.   Patient/Family's Understanding of and Emotional Response to Diagnosis, Current Treatment, and Prognosis:  Patient was very pleasant and thanked CSW for visit.   Emotional Assessment Appearance:  Appears stated age Attitude/Demeanor/Rapport:    Affect (typically observed):  Accepting, Adaptable, Pleasant Orientation:  Oriented to Self, Oriented to Place, Oriented to  Time, Oriented to Situation Alcohol / Substance use:  Not Applicable Psych involvement (Current and /or in the community):  No (Comment)  Discharge Needs  Concerns to be addressed:  No discharge needs identified Readmission within the last 30 days:  No Current discharge risk:  None Barriers to Discharge:  No Barriers Identified   Casmer Yepiz, Veronia Beets, LCSW 06/18/2018, 4:27 PM

## 2018-06-18 NOTE — Discharge Instructions (Signed)
Fall precaution. HHPTOT, RN, NA and SW.

## 2018-06-18 NOTE — Progress Notes (Signed)
Discharge instructions given to pt. IV removed. Pt dressed and discharged home with daughter.

## 2018-06-18 NOTE — Evaluation (Signed)
Physical Therapy Evaluation Patient Details Name: Gloria Barnes Age MRN: [id number] DOB: Mar 15, 1916 Today's Date: 06/18/2018   History of Present Illness  Pt is a [age] y/o F who presented s/p fall due to dizziness with resultant increased back pain.  Work-up in the ED negative for any infection.  X-ray of back with no fractures.  Pt's PMH includes LBP, R TKA.      Clinical Impression  Pt admitted with above diagnosis. Pt currently with functional limitations due to the deficits listed below (see PT Problem List). PTA pt living alone and reports several recent falls and chronic intermittent dizziness not specific to any particular activity.  Pt reports dizziness while ambulating this session and required a seated rest break due to dizziness up to 8/10. Had discussion with MD, CM, SW, and RN about concern with d/c to previous living situation (pt lives alone).  Pt is a high fall risk with several recent falls, she reports dizziness frequently with any aspect of mobility, she is unable to report what to do in an emergency, and it sounds like from chart review that she did not recall she had a life alert button until several hours, perhaps overnight, after she fell.  Therefore, recommending SNF at d/c. Pt will benefit from skilled PT to increase their independence and safety with mobility to allow discharge to the venue listed below.      Follow Up Recommendations SNF    Equipment Recommendations  None recommended by PT    Recommendations for Other Services       Precautions / Restrictions Precautions Precautions: Fall Restrictions Weight Bearing Restrictions: No      Mobility  Bed Mobility               General bed mobility comments: Pt sitting in chair at start and end of session  Transfers Overall transfer level: Needs assistance Equipment used: Rolling walker (2 wheeled) Transfers: Sit to/from Stand Sit to Stand: Min guard         General transfer comment: Pt  demonstrates unsteadiness but no LOB.  Cues for proper hand placement with sit<>stand.   Ambulation/Gait Ambulation/Gait assistance: Min guard;+2 safety/equipment Gait Distance (Feet): 80 Feet Assistive device: Rolling walker (2 wheeled) Gait Pattern/deviations: Decreased step length - right;Decreased step length - left;Trunk flexed;Narrow base of support     General Gait Details: Pt ambulating with narrow BOS and even demonstrating crossover gait, especially when turning.  Pt reports feeling dizzy after ambulating 40 ft and was instructed to sit.  Pt reports dizziness at 8/10.  BP taken in sitting 122/85.  BP taken again in standing 130/36.    Stairs            Wheelchair Mobility    Modified Rankin (Stroke Patients Only)       Balance Overall balance assessment: Needs assistance;History of Falls Sitting-balance support: No upper extremity supported;Feet supported Sitting balance-Leahy Scale: Good     Standing balance support: No upper extremity supported;During functional activity Standing balance-Leahy Scale: Fair Standing balance comment: Pt able to stand statically without UE support but would likely lose her balance with perturbation.  She requires use of RW to ambulate.                              Pertinent Vitals/Pain Pain Assessment: Faces Faces Pain Scale: No hurt Pain Location: no signs of pain Pain Intervention(s): Monitored during session    Home Living Family/patient  expects to be discharged to:: Private residence Living Arrangements: Alone Available Help at Discharge: Friend(s);Available PRN/intermittently;Neighbor Type of Home: Independent living facility Home Access: Level entry     Home Layout: One level Home Equipment: Walker - 4 wheels;Walker - 2 wheels;Shower seat;Grab bars - tub/shower;Wheelchair - manual      Prior Function Level of Independence: Needs assistance   Gait / Transfers Assistance Needed: Pt ambulates with  rollator.  Reports several recent falls.  Reports that it is common for her to experience dizziness with any aspect of mobility.   ADL's / Homemaking Assistance Needed: Family and friends do the cooking.  Pt ind with bathing, dressing.  No longer driving.  Family and friends run errands.         Hand Dominance        Extremity/Trunk Assessment   Upper Extremity Assessment Upper Extremity Assessment: Generalized weakness    Lower Extremity Assessment Lower Extremity Assessment: Generalized weakness    Cervical / Trunk Assessment Cervical / Trunk Assessment: Kyphotic  Communication   Communication: No difficulties  Cognition Arousal/Alertness: Awake/alert Behavior During Therapy: WFL for tasks assessed/performed Overall Cognitive Status: No family/caregiver present to determine baseline cognitive functioning                                 General Comments: Pt unable to report where she is but does say, "[facility]".  She has some difficulty understanding and answering PT's questions about home layout and PLOF.  When asked "if there is an emergency, what number do you call" pt says, "I can't remember".  When asked "if there is a fire in your house, which number she would you call?" pt says, "[phone]".  Per chart review it sounds like the pt laid on the floor for several hours, perhaps overnight, before pushing her life alert button.  During evaluation today pt made it sound like she all of a sudden remembered in the morning that she had a life alert button on and this is how EMS was alerted.       General Comments General comments (skin integrity, edema, etc.): Had discussion with MD (Dr. Imogene Burnhen), CM, SW, and RN about concern with d/c to previous living situation (pt lives alone).  Pt is a high fall risk with several recent falls, she reports dizziness frequently with any aspect of mobility, she is unable to report what to do in an emergency, and it sounds like from chart  review that she did not recall she had a life alert button until several hours, perhaps overnight, after she fell.  Therefore, recommending SNF at d/c.     Exercises Other Exercises Other Exercises: Instructed pt that when she does return home to ensure there is a chair in every room so that if she becomes dizzy while ambulating she can get to a chair to sit quickly, pt verbalized understanding.    Assessment/Plan    PT Assessment Patient needs continued PT services  PT Problem List Decreased balance;Decreased strength;Decreased cognition;Decreased knowledge of use of DME;Decreased safety awareness       PT Treatment Interventions DME instruction;Gait training;Functional mobility training;Therapeutic activities;Therapeutic exercise;Balance training;Neuromuscular re-education;Patient/family education;Cognitive remediation    PT Goals (Current goals can be found in the Care Plan section)  Acute Rehab PT Goals Patient Stated Goal: to return home when safe PT Goal Formulation: With patient Time For Goal Achievement: 07/02/18 Potential to Achieve Goals: Good  Frequency Min 2X/week   Barriers to discharge Decreased caregiver support Pt lives alone     Co-evaluation               AM-PAC PT "6 Clicks" Mobility  Outcome Measure Help needed turning from your back to your side while in a flat bed without using bedrails?: A Little Help needed moving from lying on your back to sitting on the side of a flat bed without using bedrails?: A Little Help needed moving to and from a bed to a chair (including a wheelchair)?: A Little Help needed standing up from a chair using your arms (e.g., wheelchair or bedside chair)?: A Little Help needed to walk in hospital room?: A Little Help needed climbing 3-5 steps with a railing? : A Lot 6 Click Score: 17    End of Session Equipment Utilized During Treatment: Gait belt Activity Tolerance: Other (comment)(limited by dizziness) Patient left:  in chair;with call bell/phone within reach;with chair alarm set;Other (comment)(with CM in room) Nurse Communication: Mobility status;Other (comment)(BP, dizziness, unsafe to return to prior home situation) PT Visit Diagnosis: Unsteadiness on feet (R26.81);Dizziness and giddiness (R42);Other abnormalities of gait and mobility (R26.89);Muscle weakness (generalized) (M62.81)    Time: 5726-2035 PT Time Calculation (min) (ACUTE ONLY): 31 min   Charges:   PT Evaluation $PT Eval Moderate Complexity: 1 Mod PT Treatments $Gait Training: 8-22 mins        Encarnacion Chu PT, DPT 06/18/2018, 1:26 PM

## 2019-05-08 ENCOUNTER — Other Ambulatory Visit: Payer: Self-pay | Admitting: Family Medicine

## 2019-05-08 DIAGNOSIS — R413 Other amnesia: Secondary | ICD-10-CM

## 2019-05-08 DIAGNOSIS — R410 Disorientation, unspecified: Secondary | ICD-10-CM

## 2019-05-14 ENCOUNTER — Other Ambulatory Visit: Payer: Self-pay | Admitting: Family Medicine

## 2019-05-14 ENCOUNTER — Telehealth (HOSPITAL_COMMUNITY): Payer: Self-pay

## 2019-05-14 DIAGNOSIS — R413 Other amnesia: Secondary | ICD-10-CM

## 2019-05-14 DIAGNOSIS — R410 Disorientation, unspecified: Secondary | ICD-10-CM

## 2019-05-20 ENCOUNTER — Ambulatory Visit: Payer: Medicare Other

## 2019-05-27 ENCOUNTER — Ambulatory Visit
Admission: RE | Admit: 2019-05-27 | Discharge: 2019-05-27 | Disposition: A | Discharge status: Home / Self Care | Payer: Medicare Other | Source: Ambulatory Visit | Attending: Family Medicine | Admitting: Family Medicine

## 2019-05-27 ENCOUNTER — Other Ambulatory Visit: Payer: Self-pay

## 2019-05-27 DIAGNOSIS — K449 Diaphragmatic hernia without obstruction or gangrene: Secondary | ICD-10-CM | POA: Diagnosis not present

## 2019-05-27 DIAGNOSIS — I700 Atherosclerosis of aorta: Secondary | ICD-10-CM | POA: Insufficient documentation

## 2019-05-27 DIAGNOSIS — R601 Generalized edema: Secondary | ICD-10-CM | POA: Insufficient documentation

## 2019-05-27 DIAGNOSIS — K6389 Other specified diseases of intestine: Secondary | ICD-10-CM | POA: Diagnosis not present

## 2019-05-27 DIAGNOSIS — N281 Cyst of kidney, acquired: Secondary | ICD-10-CM | POA: Diagnosis not present

## 2019-05-27 DIAGNOSIS — R413 Other amnesia: Secondary | ICD-10-CM

## 2019-05-27 DIAGNOSIS — R7989 Other specified abnormal findings of blood chemistry: Secondary | ICD-10-CM | POA: Insufficient documentation

## 2019-05-27 DIAGNOSIS — D739 Disease of spleen, unspecified: Secondary | ICD-10-CM | POA: Diagnosis not present

## 2019-05-27 DIAGNOSIS — R410 Disorientation, unspecified: Secondary | ICD-10-CM | POA: Diagnosis present

## 2019-05-27 MED ORDER — IOHEXOL 300 MG/ML  SOLN
100.0000 mL | Freq: Once | INTRAMUSCULAR | Status: AC | PRN
  Administered 2019-05-27: 15:00:00 100 mL via INTRAVENOUS

## 2019-06-20 ENCOUNTER — Telehealth: Payer: Self-pay | Admitting: Adult Health Nurse Practitioner

## 2019-06-20 NOTE — Telephone Encounter (Signed)
Spoke with patient's daughter Salvadore Oxford regarding Palliative services and she was in agreement with this.  I have scheduled a Telephone Consult for 06/24/19 @ 9 AM.

## 2019-06-24 ENCOUNTER — Other Ambulatory Visit: Payer: Medicare Other | Admitting: Adult Health Nurse Practitioner

## 2019-06-24 ENCOUNTER — Other Ambulatory Visit: Payer: Self-pay

## 2019-06-24 DIAGNOSIS — Z515 Encounter for palliative care: Secondary | ICD-10-CM

## 2019-06-24 DIAGNOSIS — I509 Heart failure, unspecified: Secondary | ICD-10-CM

## 2019-06-24 NOTE — Progress Notes (Addendum)
[facility] Consult Note Telephone: [phone]  Fax: [phone]  PATIENT NAME: Gloria Barnes DOB: 09/01/1915 MRN: [id number]  PRIMARY CARE PROVIDER:   Baxter Hire, MD  REFERRING PROVIDER:  Baxter Hire, MD [place],  [place] [address]  RESPONSIBLE PARTY:   Casimer Lanius, daughter Parks, caregiver [phone]  Due to the COVID-19 crisis, this visit was done via telemedicine and it was initiated and consent by this patient and or family. Video-audio (telehealth) contact was unable to be done due to technical barriers from the patient's side.    RECOMMENDATIONS and PLAN:  1.  Advanced care planning.  Patient is full code.  Started discussion with daughter about MOST form. Did not fill out today.  Discussed disease progression and daughter is agreeable with hospice if patient is eligible.  2.  CHF.  Marland Kitchen  Last note in EPIC shows decline in EF to 25% but unable to find what it was previously.  PCP's note indicates that it was a significant decline and that her CHF is contributing to worsening liver function.  On 02/11/2019 Alt 18  AST 35 alk phos 121  on  05/06/2019 ALT 90  AST 80  alk phos 485. She is having more edema and anascara. Currently has blister on leg with some weeping noted by caregiver. Patient's daughter states that over the past year she has had more confusion/forgetfulness (some days worse than others).  Patient states that she is tired a lot and she will sometimes just sit on the floor instead of walking to a  chair.  Has been found sleeping on the toilet.  Daughter states that she does at times walk with a cane and is needing more assistance with ADLs.  She is having more episodes of urinary incontinence.  Daughter states that she is sleeping more and not eating as much.  Unable to tell if weight loss due to increased edema. She does supplement with Boost. Denies increased SOB or cough,  chest pain.   Spoke with hospice physicians and they are in agreement that patient is eligible for hospice.  Received hospice referral from Dr. Edwina Barth and he agrees to continue to be attending.  I spent 50 minutes providing this consultation, including time with patient/family, chart review, provider coordination, and documentation. More than 50% of the time in this consultation was spent coordinating communication.   HISTORY OF PRESENT ILLNESS:  Gloria Barnes is a 86 y.o. year old female with multiple medical problems including CHF, COPD, OSA, OA, hiatal hernia. Palliative Care was asked to help address goals of care.   CODE STATUS: see above  PPS: 50% HOSPICE ELIGIBILITY/DIAGNOSIS: yes/CHF  PHYSICAL EXAM:   Deferred  PAST MEDICAL HISTORY:  Past Medical History:  Diagnosis Date  . Arthritis   . Low back pain     SOCIAL HX:  Social History   Tobacco Use  . Smoking status: Never Smoker  . Smokeless tobacco: Never Used  Substance Use Topics  . Alcohol use: Not Currently    ALLERGIES: No Known Allergies   PERTINENT MEDICATIONS:  Outpatient Encounter Medications as of 06/24/2019  Medication Sig  . acetaminophen (TYLENOL) 650 MG CR tablet Take 650 mg by mouth every 8 (eight) hours as needed.  . diclofenac sodium (VOLTAREN) 1 % GEL APPLY AS NEEDED ON THE PAINFUL AREA  . latanoprost (XALATAN) 0.005 % ophthalmic solution Place 1 drop into both eyes at bedtime.  . Multiple  Vitamin (MULTI-VITAMINS) TABS Take 1 tablet by mouth daily.  . Omega-3 Fatty Acids (FISH OIL) 1000 MG CAPS Take 1,000 mg by mouth daily.  Marland Kitchen omeprazole (PRILOSEC) 20 MG capsule Take 20 mg by mouth 2 (two) times daily.  . Vitamin D, Ergocalciferol, (DRISDOL) 1.25 MG (50000 UT) CAPS capsule Take 50,000 Units by mouth once a week.   No facility-administered encounter medications on file as of 06/24/2019.       Kooper Chriswell Jenetta Downer, NP

## 2021-05-05 DEATH — deceased

## 2021-10-08 IMAGING — CT CT ABD-PELV W/ CM
3 of 5 series · 14 of 47 positions shown, 16 images · IV contrast (omnipaque)
Comparison: CT Abdomen and Pelvis 02/13/2012.

CLINICAL DATA: [AGE] female with urinary incontinence and
low back pain.

EXAM:
CT ABDOMEN AND PELVIS WITH CONTRAST
TECHNIQUE: Multidetector CT imaging of the abdomen and pelvis was performed
using the standard protocol following bolus administration of
intravenous contrast.
CONTRAST:  100mL OMNIPAQUE IOHEXOL 300 MG/ML  SOLN

[Series 3: abd pelvis 5.00 · axial · 0.87mm/px · z∈[-1181,-796]mm · 8 of 95 slices shown, 10 images]
[im 12/95  brain]
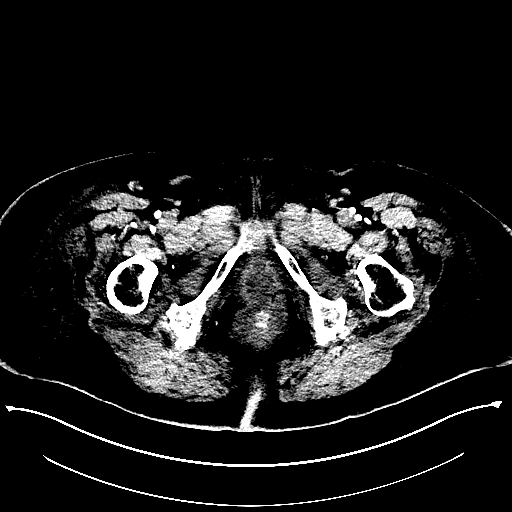
[im 12/95  bone]
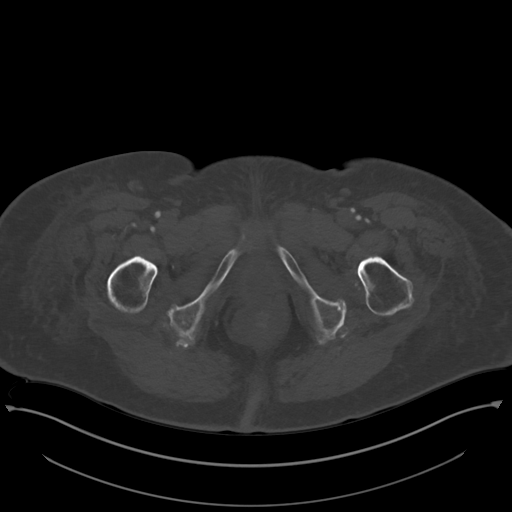
[im 23/95  brain]
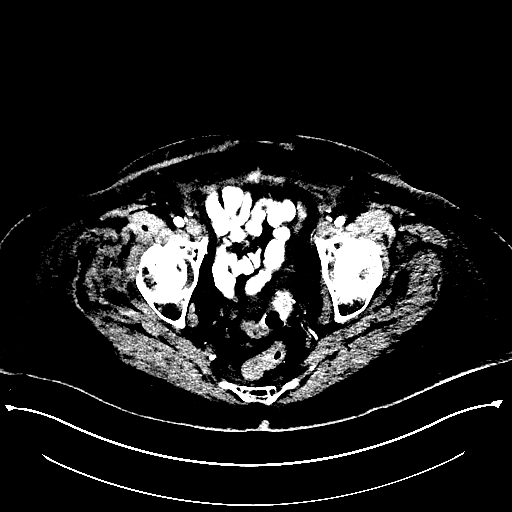
[im 34/95  brain]
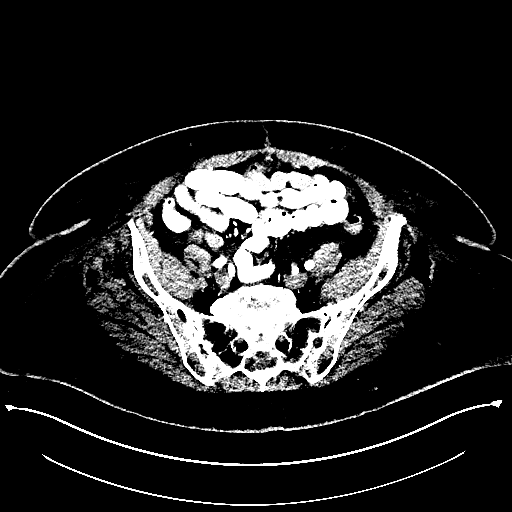
[im 45/95  brain]
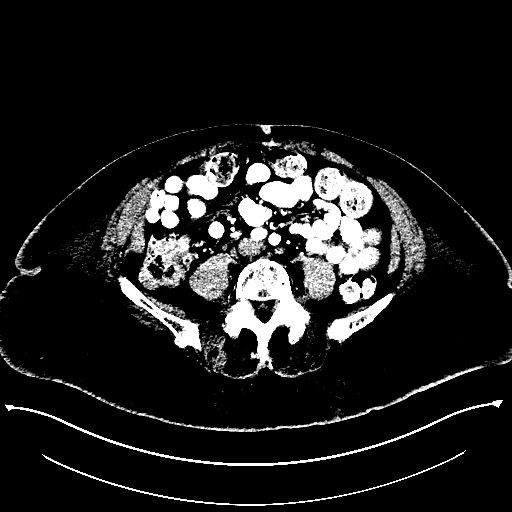
[im 56/95  brain]
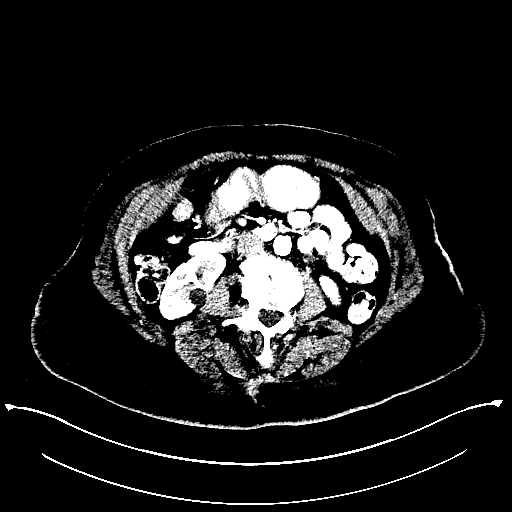
[im 56/95  bone]
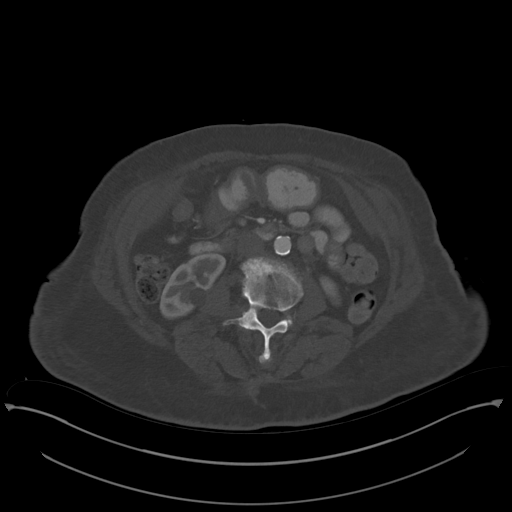
[im 67/95  brain]
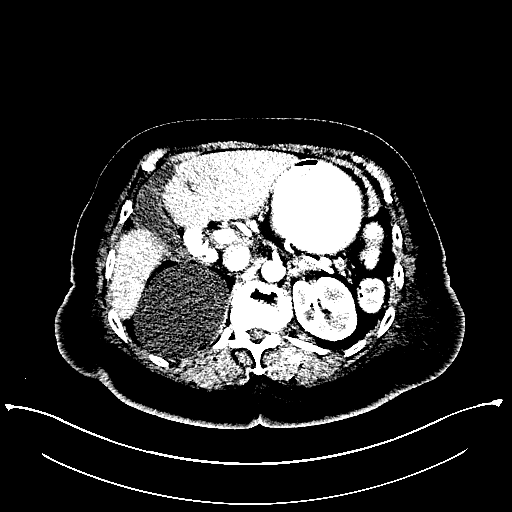
[im 78/95  brain]
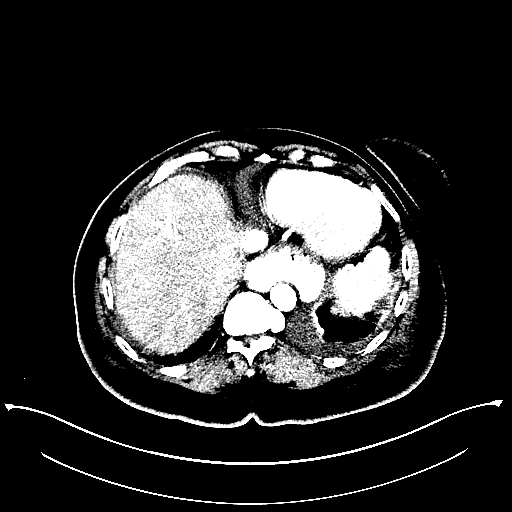
[im 89/95  brain]
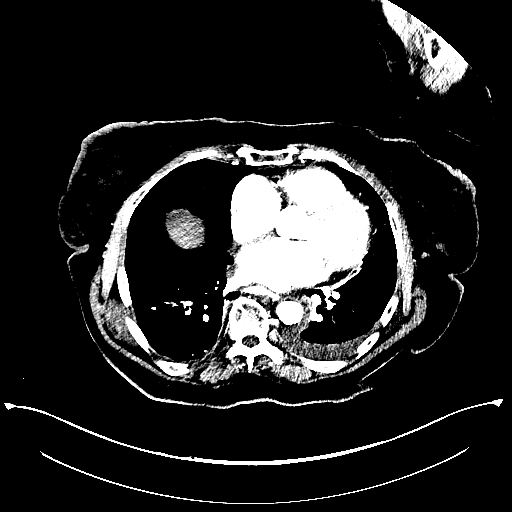

[Series 5: coronals abd pelvis 2.00 cor · coronal · 0.87mm/px · 3 of 189 slices shown]
[im 63/189  brain]
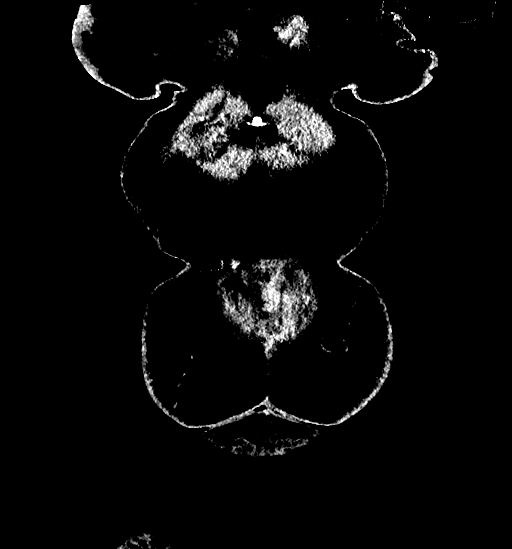
[im 84/189  brain]
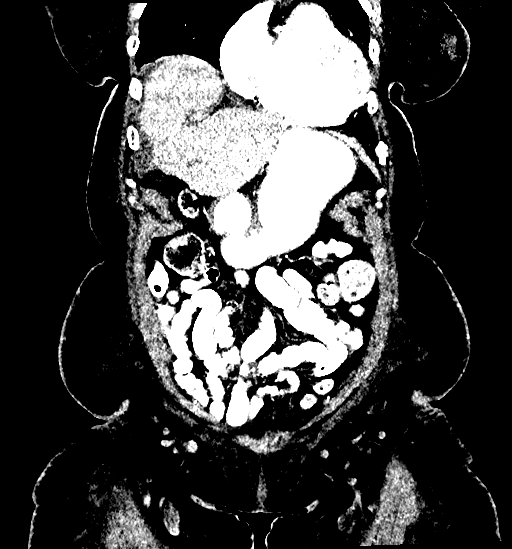
[im 105/189  brain]
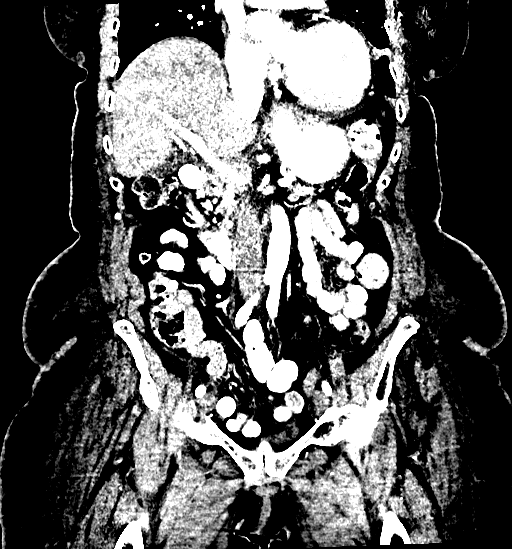

[Series 7: sagittals abd pelvis 2.00 sag · sagittal · 0.74mm/px · 3 of 221 slices shown]
[im 74/221  brain]
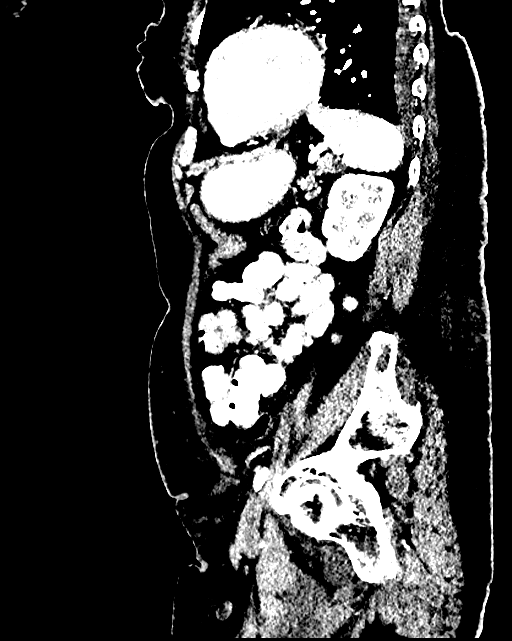
[im 111/221  brain]
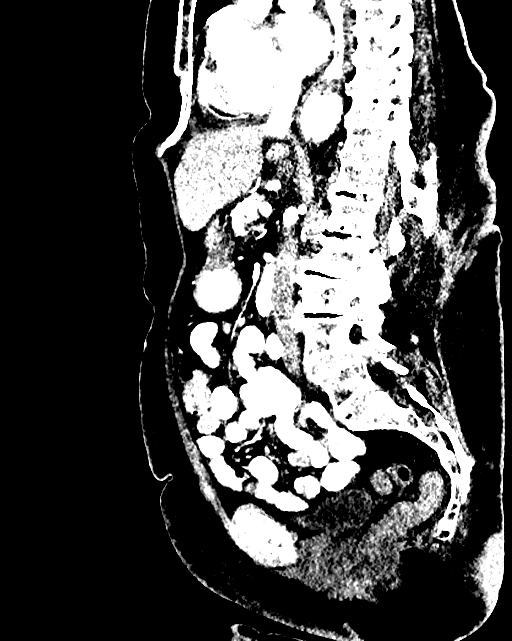
[im 147/221  brain]
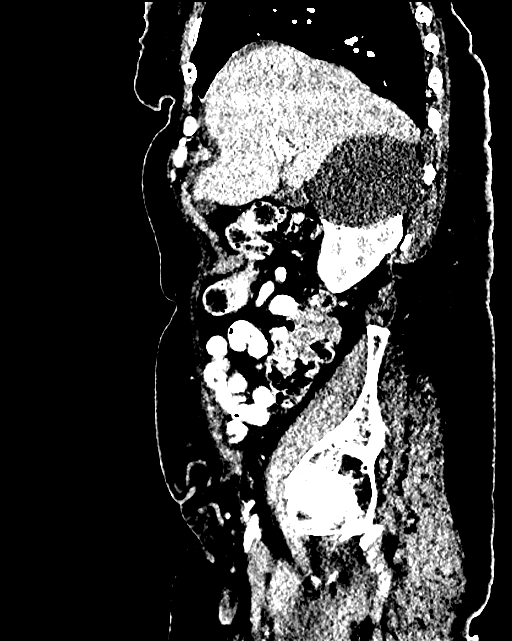

[14 of 47 positions shown; findings below may reference images not displayed]

FINDINGS: Lower chest: A small layering but partially loculated left pleural
effusion is new since 7838. There is mild associated compressive
atelectasis in left lower lung. Cardiomegaly has progressed. No
pericardial effusion. Descending thoracic aorta atherosclerosis. New
trace pleural fluid or thickening in the contralateral right lung
with minor right lung base scarring.

Hepatobiliary: Trace new perihepatic free fluid suspected. Liver
enhancement now appears mildly coarse but there is no discrete liver
lesion. Gallbladder appears to remain negative. No bile duct
enlargement.

Pancreas: Negative.

Spleen: Within the posterior body of the spleen there is a round 38
millimeter heterogeneously hyperenhancing lesion (series 3, image
22). There was subtle evidence of heterogeneous enhancement of the
spleen here in 7838, only 10-15 millimeters at that time. But the
remainder of the spleen enhances normally and there is no
perisplenic fluid.

Adrenals/Urinary Tract: Adrenal glands are stable and within normal
limits. There is a large but simple fluid density right renal upper
pole cyst measuring 8.5 centimeters which has mildly increased since
7838.

Much smaller right renal lower pole cyst. Symmetric renal
enhancement and contrast excretion. Proximal ureters are
decompressed and normal.

The urinary bladder today is diminutive and unremarkable. Chronic
pelvic phleboliths are noted.

No lymphadenopathy.

Stomach/Bowel: Decompressed rectum. There is a mid splenic
anastomosis with no adverse features (sagittal image 128). Mostly
decompressed and negative sigmoid colon. Intermittent retained stool
elsewhere throughout the large bowel which is mildly redundant.
Diverticulosis in the right colon. No large bowel inflammation
identified. Diminutive or absent appendix. Oral contrast has not yet
reached the terminal ileum.

No dilated or abnormal small bowel loops identified. Most loops are
opacified with contrast which has not yet reached the distal small
bowel.

Moderate size gastric hiatal hernia is associated with contrast
distension of the visible distal esophagus. Negative intraabdominal
stomach and duodenum.

No free air.

Vascular/Lymphatic: Extensive Aortoiliac calcified atherosclerosis.
Major arterial structures remain patent. Portal venous system
contrast is early. The main portal vein appears to be patent. No
lymphadenopathy.

Reproductive: Surgically absent uterus. Diminutive or absent
ovaries.

Other: Trace pelvic or presacral fluid or soft tissue edema. Mild
generalized body wall edema is new since 7838.

Musculoskeletal: Advanced lumbar spine degeneration. Mild chronic
spondylolisthesis at L4-L5 and previous L3 posterior decompression.
Widespread vacuum disc. Less pronounced degenerative changes in the
lower thoracic spine. No acute or suspicious osseous lesion
identified.
IMPRESSION: 1. A degree of anasarca is new since 7838, including small left
pleural effusion, trace free fluid in the abdomen and pelvis, and
body wall edema. Cardiomegaly has progressed since that time, no
pericardial effusion.

2. Chronic hiatal hernia. Sigmoid colon anastomosis with no adverse
features. No bowel obstruction or inflammation. Although there could
be esophageal dysmotility.

3. Enlarged since 7838 but probably benign splenic lesion, favor
hemangioma. Chronic benign right renal cysts.

4. Aortic Atherosclerosis ([id number]). Chronically advanced
degenerative changes in the lower spine.
